# Patient Record
Sex: Male | Born: 1950
Health system: Southern US, Community
[De-identification: ages and names within clinical notes are randomized; demographics above are authoritative.]

## PROBLEM LIST (undated history)

## (undated) DIAGNOSIS — I639 Cerebral infarction, unspecified: Secondary | ICD-10-CM

## (undated) DIAGNOSIS — I10 Essential (primary) hypertension: Secondary | ICD-10-CM

## (undated) HISTORY — PX: OTHER SURGICAL HISTORY: SHX169

## (undated) HISTORY — DX: Cerebral infarction, unspecified: I63.9

## (undated) HISTORY — DX: Essential (primary) hypertension: I10

---

## 2004-05-12 ENCOUNTER — Encounter: Admission: RE | Admit: 2004-05-12 | Discharge: 2004-05-12 | Payer: Self-pay | Admitting: Family Medicine

## 2006-03-28 IMAGING — CT CT HEAD WO/W CM
4 of 8 series · 11 of 30 positions shown, 12 images · IV contrast (omnipaque)
Comparison: none

CLINICAL DATA: Left greater than right ear pain, headaches radiating to frontal region. 
HEAD CT PRE AND POST CONTRAST ? 05/12/04 
Cranial CT was performed before and after administration of 75 cc Omnipaque 300 intravenous contrast.

[Series 3: axial · axial · 0.33mm/px · z∈[+7,+27]mm · 2 of 96 slices shown]
[im 32/96  brain]
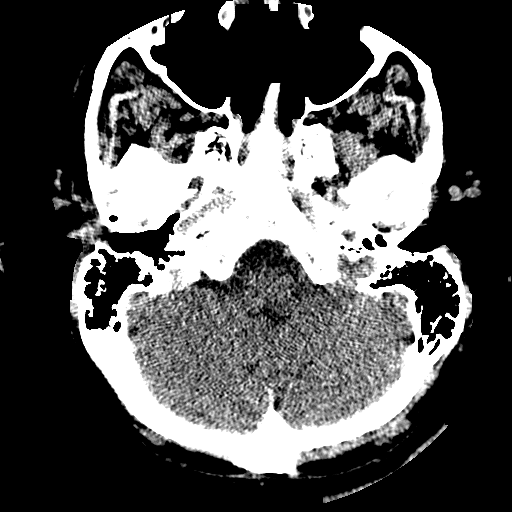
[im 64/96  brain]
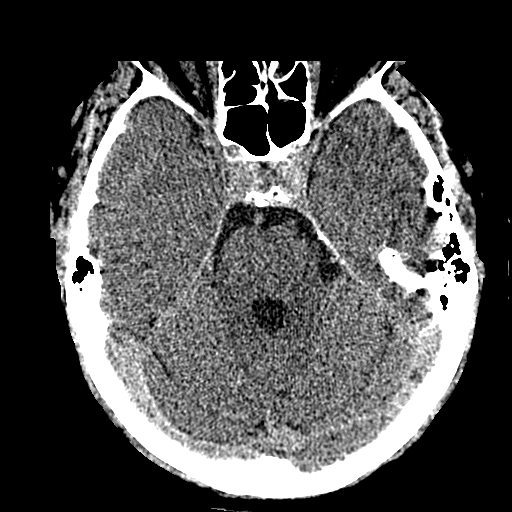

[Series 9: coronal · axial · 0.33mm/px · z∈[-17,+21]mm · 3 of 112 slices shown, 4 images]
[im 28/112  brain]
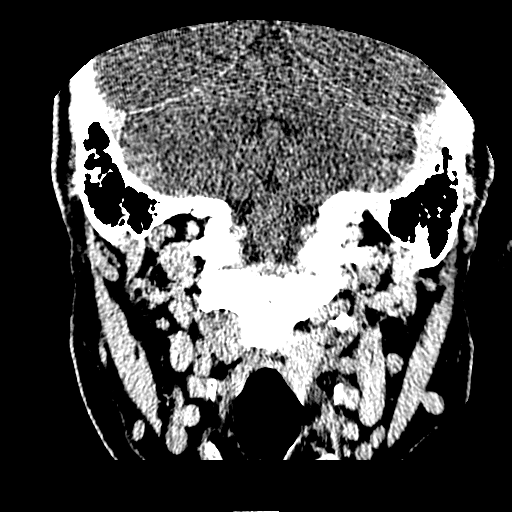
[im 28/112  bone]
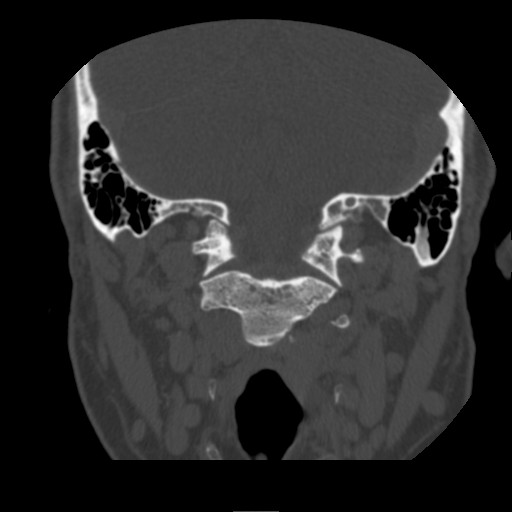
[im 56/112  brain]
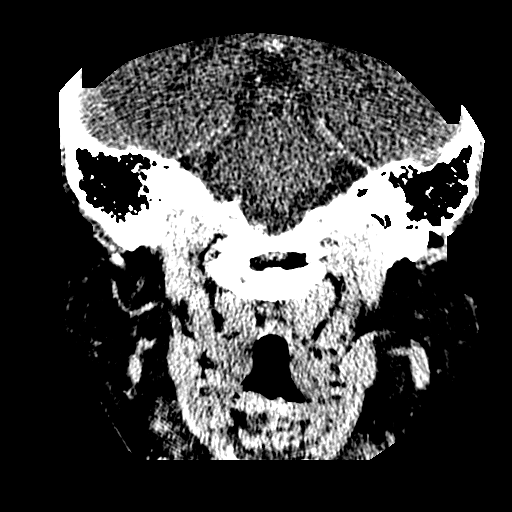
[im 84/112  brain]
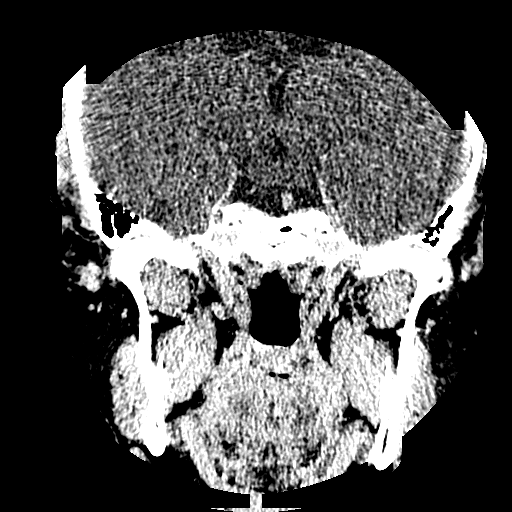

[Series 10: recon 2: coronal · axial · 0.19mm/px · z∈[-31,+7]mm · 3 of 112 slices shown]
[im 28/112  brain]
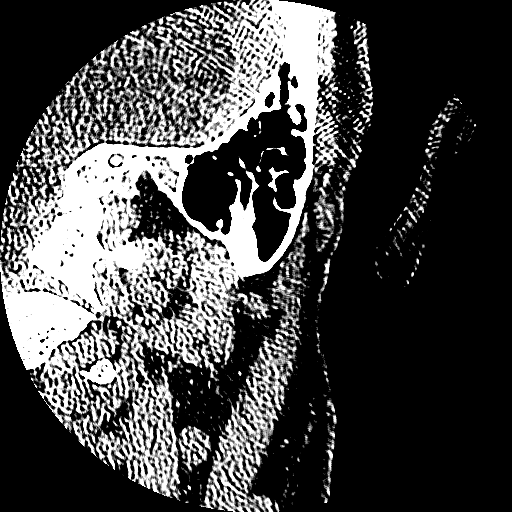
[im 56/112  brain]
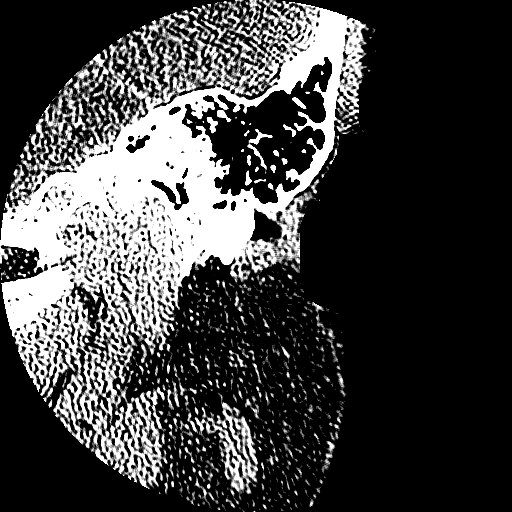
[im 84/112  brain]
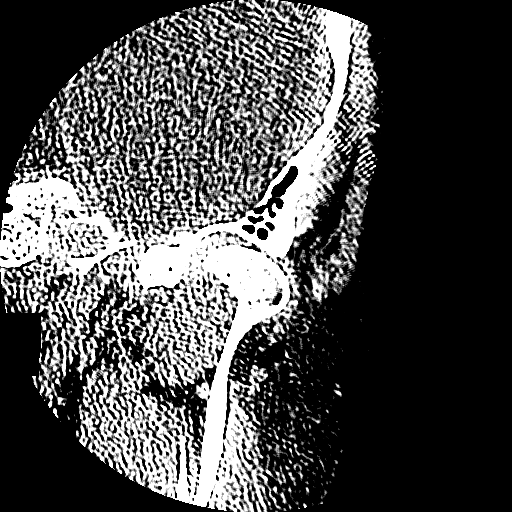

[Series 11: recon 3: coronal · axial · 0.19mm/px · z∈[-31,+7]mm · 3 of 112 slices shown]
[im 28/112  brain]
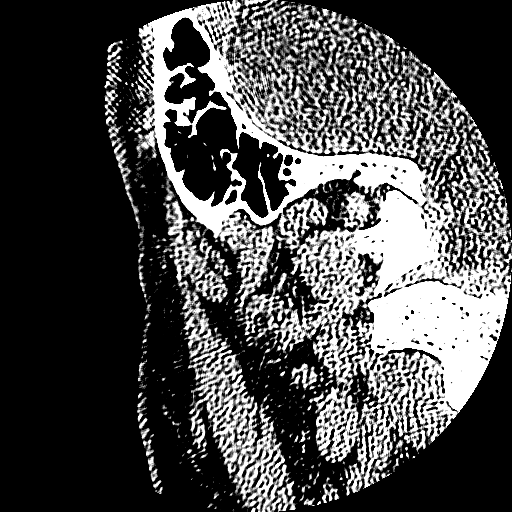
[im 56/112  brain]
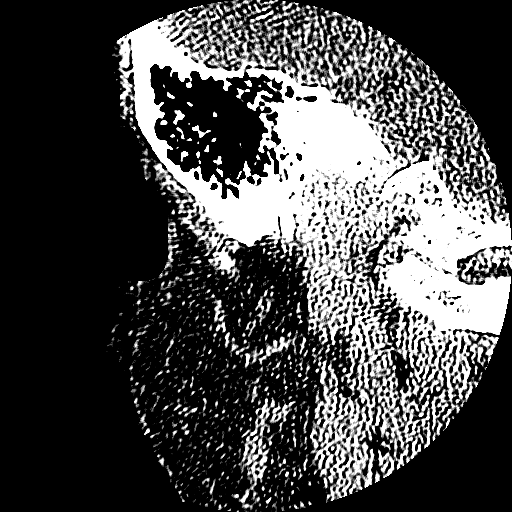
[im 84/112  brain]
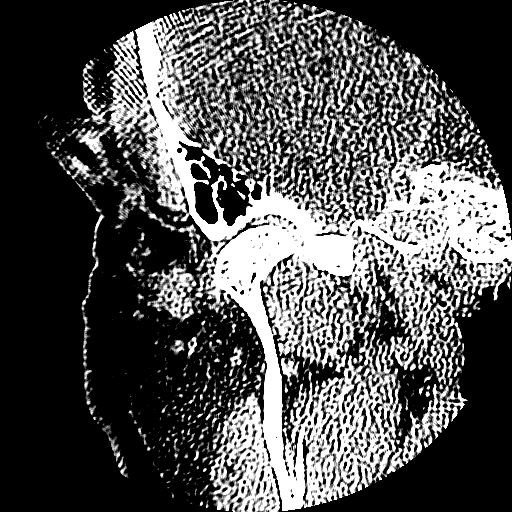

[11 of 30 positions shown; findings below may reference images not displayed]

There is no evidence of enhancing lesions, brain edema, mass effect or intracranial hemorrhage. The ventricles are normal. No extra-axial abnormalities are identified.   Slight mucosal thickening is seen at the bilateral ethmoid air cells consistent with mild chronic ethmoid sinusitis.  

IMPRESSION
1.  Mild chronic ethmoid sinusitis.
2.  Otherwise normal cranial CT.
PETROUS TEMPORAL BONE CT, PRE AND POST CONTRAST ? 05/12/04 
High resolution algorithm axial and direct coronal images at 0.63 m collimation were obtained through the petrous temporal bones pre and post IV contrast administration from preceding head CT.  Bilateral mastoid air cells, middle and external auditory canals are clear.  Bilateral ossicles, tympanic membranes, scutum, tegmen, tympanic sinus, bilateral descending facial nerve canals and bilateral structures of the otic capsule appear normal.  
IMPRESSION
1.  Mild chronic ethmoid and maxillary sinusitis.
2.  Otherwise normal.

## 2007-09-11 ENCOUNTER — Ambulatory Visit (HOSPITAL_BASED_OUTPATIENT_CLINIC_OR_DEPARTMENT_OTHER): Admission: RE | Admit: 2007-09-11 | Discharge: 2007-09-12 | Payer: Self-pay | Admitting: Orthopedic Surgery

## 2008-01-22 ENCOUNTER — Ambulatory Visit (HOSPITAL_BASED_OUTPATIENT_CLINIC_OR_DEPARTMENT_OTHER): Admission: RE | Admit: 2008-01-22 | Discharge: 2008-01-22 | Payer: Self-pay | Admitting: Family Medicine

## 2008-02-01 ENCOUNTER — Ambulatory Visit: Payer: Self-pay | Admitting: Internal Medicine

## 2011-01-17 NOTE — Op Note (Signed)
NAMEDUDLEY, MAGES                ACCOUNT NO.:  [id number]   MEDICAL RECORD NO.:  [id number]          PATIENT TYPE:  AMB   LOCATION:  DSC                          FACILITY:  [facility]   PHYSICIAN:  Mila Homer. Sherlean Foot, M.D. DATE OF BIRTH:  06-Aug-1951   DATE OF PROCEDURE:  09/11/2007  DATE OF DISCHARGE:                               OPERATIVE REPORT   SURGEON:  Mila Homer. Sherlean Foot, M.D.   ASSISTANT:  Legrand Pitts. Duffy, P.A.   ANESTHESIA:  General.   PREOPERATIVE DIAGNOSIS:  Lateral tibial plateau fracture, right knee.   POSTOPERATIVE DIAGNOSIS:  Lateral tibial plateau fracture, right knee.   PROCEDURE:  Right knee arthroscopy with percutaneous screw fixation of  the tibial plateau fracture.   INDICATIONS FOR PROCEDURE:  Patient is 60 years old.  He fell off of a  ladder over the weekend.  He has a displaced lateral tibial plateau  fracture.  Informed consent was obtained.   DESCRIPTION OF PROCEDURE:  Patient was laid supine, administered general  anesthesia.  The right leg was prepped and draped in the usual sterile  fashion.  Inferolateral and inferomedial portals were created with a #11  blade, blunt trocar, and cannula.  The fascial hematoma was evacuated  immediately.  The lead was then lavaged with the great white shaver and  the pump system.  Medial and patellofemoral quadrants were normal.  Then  went into the lateral compartment with a little bit of flexion and varus  stress and identified the tibial plateau fracture.  It was in fact  laying underneath the body of the lateral meniscus.  I then reduced it  with the Darrick Penna reduction clamps under AP and lateral C-arm imaging.  I then removed the scope and placed two percutaneous screws, 70 and 80  mm screws respectively, anterior and posteriorly, checking on AP and  lateral C-arm imaging to insure appropriate length, then recheck with  the arthroscopy view to make sure everything was reduced.  I then closed  with 4-0 nylon  sutures, dressed with a Xeroform dressing, sterile  Webril.   COMPLICATIONS:  None.   DRAINS:  None.           ______________________________  Mila Homer. Sherlean Foot, M.D.     SDL/MEDQ  D:  09/11/2007  T:  09/11/2007  Job:  [id number]

## 2011-01-17 NOTE — Procedures (Signed)
NAMEVIOLET, CART                ACCOUNT NO.:  [id number]   MEDICAL RECORD NO.:  [id number]          PATIENT TYPE:  OUT   LOCATION:  SLEEP CENTER                 FACILITY:  [facility]   PHYSICIAN:  Clinton D. Maple Hudson, MD, FCCP, FACPDATE OF BIRTH:  01[birth year]   DATE OF STUDY:  01/22/2008                            NOCTURNAL POLYSOMNOGRAM   REFERRING PHYSICIAN:   REFERRING PHYSICIAN:  Dr. Jeanmarie Plant.   INDICATION FOR STUDY:  Hypersomnia with sleep apnea.   EPWORTH SLEEPINESS SCORE:  5/24.  BMI 32.3.  Weight 200 pounds.  Height  66 inches.  Neck 16-1/2 inches.   HOME MEDICATIONS:  Charted and reviewed.   SLEEP ARCHITECTURE:  Total sleep time 259.5 minutes with sleep  efficiency 16.3%.  Stage 1 was 11.4%.  Stage 2 77%.  Stage 3 absent.  REM 11.6% of total sleep time.  Sleep latency 57.5 minutes.  REM latency  198 minutes.  Awake after sleep onset 112 minutes.  Arousal index 19.9.  No bedtime medication was taken.   RESPIRATORY DATA:  Apnea hypopnea index (AHI) 5.5 per hour with  respiratory disturbance index (RDI) 10.4 per hour.  This indicates mild  sleep apnea syndrome.  A total of 24 events were counted, all hypopneas,  nonpositional, with REM AHI 20 per hour.  There were insufficient events  to permit CPAP titration by split protocol on the study night.   OXYGEN DATA:  Moderately snoring with oxygen desaturation to a nadir of  79%.  Mean oxygen saturation through the study was 89.6% on room air.  A  total of 35.5 minutes were spent with oxygen saturation less than 88%.   CARDIAC DATA:  Normal sinus rhythm.   MOVEMENT-PARASOMNIA:  Limb jerks were noted averaging 8.3 per hour but  not associated with arousal from sleep.  Bathroom x1.   IMPRESSIONS-RECOMMENDATIONS:  1. Mild obstructive sleep apnea/hypopnea syndrome, AHI 5.5 per hour,      RDI 10.4 per hour.  Events were not positional.  Moderately snoring      with oxygen desaturation to a nadir of 79%.  2. There were  insufficient events on this study night to permit split      protocol CPAP titration.  An RDI of 10.4 would suggest potential      for treatment with CPAP.  Consider return for CPAP titration if      appropriate.  Otherwise, consider alternative therapies.  3. Mean oxygen saturation to the study was only 89.6% with a total of      35.5 minutes of sleep recorded with      saturations less than 88%.  This suggests underlying      cardiopulmonary disease.  Consider oxygen supplementation during      sleep.      Clinton D. Maple Hudson, MD, [facility], FACP  Diplomate, Biomedical engineer of Sleep Medicine  Electronically Signed     CDY/MEDQ  D:  02/01/2008 11:36:42  T:  02/01/2008 11:51:22  Job:  [id number]

## 2011-05-25 LAB — I-STAT 8, (EC8 V) (CONVERTED LAB)
Acid-Base Excess: 2
BUN: 16
Bicarbonate: 27.2 — ABNORMAL HIGH
Chloride: 102
HCT: 49
Hemoglobin: 16.7
Potassium: 4.1
Sodium: 137
pCO2, Ven: 41.4 — ABNORMAL LOW

## 2018-06-12 DIAGNOSIS — I517 Cardiomegaly: Secondary | ICD-10-CM

## 2018-06-12 DIAGNOSIS — J9601 Acute respiratory failure with hypoxia: Secondary | ICD-10-CM | POA: Diagnosis not present

## 2018-06-12 DIAGNOSIS — I639 Cerebral infarction, unspecified: Secondary | ICD-10-CM | POA: Diagnosis not present

## 2018-06-12 DIAGNOSIS — I10 Essential (primary) hypertension: Secondary | ICD-10-CM | POA: Diagnosis not present

## 2018-06-12 DIAGNOSIS — R7303 Prediabetes: Secondary | ICD-10-CM

## 2018-06-12 DIAGNOSIS — I351 Nonrheumatic aortic (valve) insufficiency: Secondary | ICD-10-CM

## 2018-06-13 DIAGNOSIS — R7303 Prediabetes: Secondary | ICD-10-CM | POA: Diagnosis not present

## 2018-06-13 DIAGNOSIS — I639 Cerebral infarction, unspecified: Secondary | ICD-10-CM | POA: Diagnosis not present

## 2018-06-13 DIAGNOSIS — J9601 Acute respiratory failure with hypoxia: Secondary | ICD-10-CM | POA: Diagnosis not present

## 2018-06-13 DIAGNOSIS — I10 Essential (primary) hypertension: Secondary | ICD-10-CM | POA: Diagnosis not present

## 2018-06-14 DIAGNOSIS — J9601 Acute respiratory failure with hypoxia: Secondary | ICD-10-CM | POA: Diagnosis not present

## 2018-06-14 DIAGNOSIS — I639 Cerebral infarction, unspecified: Secondary | ICD-10-CM | POA: Diagnosis not present

## 2018-06-14 DIAGNOSIS — R7303 Prediabetes: Secondary | ICD-10-CM | POA: Diagnosis not present

## 2018-06-14 DIAGNOSIS — I10 Essential (primary) hypertension: Secondary | ICD-10-CM | POA: Diagnosis not present

## 2018-06-15 DIAGNOSIS — R7303 Prediabetes: Secondary | ICD-10-CM | POA: Diagnosis not present

## 2018-06-15 DIAGNOSIS — I639 Cerebral infarction, unspecified: Secondary | ICD-10-CM | POA: Diagnosis not present

## 2018-06-15 DIAGNOSIS — J9601 Acute respiratory failure with hypoxia: Secondary | ICD-10-CM | POA: Diagnosis not present

## 2018-06-15 DIAGNOSIS — I10 Essential (primary) hypertension: Secondary | ICD-10-CM | POA: Diagnosis not present

## 2018-07-09 ENCOUNTER — Ambulatory Visit: Payer: BLUE CROSS/BLUE SHIELD | Admitting: Neurology

## 2018-07-09 ENCOUNTER — Encounter: Payer: Self-pay | Admitting: Neurology

## 2018-07-09 VITALS — BP 142/96 | HR 82 | Ht 65.0 in | Wt 248.0 lb

## 2018-07-09 DIAGNOSIS — I6381 Other cerebral infarction due to occlusion or stenosis of small artery: Secondary | ICD-10-CM | POA: Diagnosis not present

## 2018-07-09 DIAGNOSIS — I69351 Hemiplegia and hemiparesis following cerebral infarction affecting right dominant side: Secondary | ICD-10-CM

## 2018-07-09 NOTE — Patient Instructions (Signed)
I had a long d/w patient about his recent stroke, risk for recurrent stroke/TIAs, personally independently reviewed imaging studies and stroke evaluation results and answered questions.Continue aspirin 325 mg daily  for secondary stroke prevention and maintain strict control of hypertension with blood pressure goal below 130/90, diabetes with hemoglobin A1c goal below 6.5% and lipids with LDL cholesterol goal below 70 mg/dL. I also advised the patient to eat a healthy diet with plenty of whole grains, cereals, fruits and vegetables, exercise regularly and maintain ideal body weight.  I encouraged the patient to continue ongoing home physical and occupational therapy and subsequently transition to outpatient therapy as well when it is completed. I also encouraged him to consider getting the outpatient sleep study to test for sleep apnea but the patient is declining this at the present time. Followup in the future with my nurse practitioner in 3 months or call earlier if needed.  Stroke Prevention Some medical conditions and behaviors are associated with a higher chance of having a stroke. You can help prevent a stroke by making nutrition, lifestyle, and other changes, including managing any medical conditions you may have. What nutrition changes can be made?  Eat healthy foods. You can do this by: ? Choosing foods high in fiber, such as fresh fruits and vegetables and whole grains. ? Eating at least 5 or more servings of fruits and vegetables a day. Try to fill half of your plate at each meal with fruits and vegetables. ? Choosing lean protein foods, such as lean cuts of meat, poultry without skin, fish, tofu, beans, and nuts. ? Eating low-fat dairy products. ? Avoiding foods that are high in salt (sodium). This can help lower blood pressure. ? Avoiding foods that have saturated fat, trans fat, and cholesterol. This can help prevent high cholesterol. ? Avoiding processed and premade foods.  Follow your  health care provider's specific guidelines for losing weight, controlling high blood pressure (hypertension), lowering high cholesterol, and managing diabetes. These may include: ? Reducing your daily calorie intake. ? Limiting your daily sodium intake to 1,500 milligrams (mg). ? Using only healthy fats for cooking, such as olive oil, canola oil, or sunflower oil. ? Counting your daily carbohydrate intake. What lifestyle changes can be made?  Maintain a healthy weight. Talk to your health care provider about your ideal weight.  Get at least 30 minutes of moderate physical activity at least 5 days a week. Moderate activity includes brisk walking, biking, and swimming.  Do not use any products that contain nicotine or tobacco, such as cigarettes and e-cigarettes. If you need help quitting, ask your health care provider. It may also be helpful to avoid exposure to secondhand smoke.  Limit alcohol intake to no more than 1 drink a day for nonpregnant women and 2 drinks a day for men. One drink equals 12 oz of beer, 5 oz of wine, or 1 oz of hard liquor.  Stop any illegal drug use.  Avoid taking birth control pills. Talk to your health care provider about the risks of taking birth control pills if: ? You are over 18 years old. ? You smoke. ? You get migraines. ? You have ever had a blood clot. What other changes can be made?  Manage your cholesterol levels. ? Eating a healthy diet is important for preventing high cholesterol. If cholesterol cannot be managed through diet alone, you may also need to take medicines. ? Take any prescribed medicines to control your cholesterol as told by your health  care provider.  Manage your diabetes. ? Eating a healthy diet and exercising regularly are important parts of managing your blood sugar. If your blood sugar cannot be managed through diet and exercise, you may need to take medicines. ? Take any prescribed medicines to control your diabetes as told by  your health care provider.  Control your hypertension. ? To reduce your risk of stroke, try to keep your blood pressure below 130/80. ? Eating a healthy diet and exercising regularly are an important part of controlling your blood pressure. If your blood pressure cannot be managed through diet and exercise, you may need to take medicines. ? Take any prescribed medicines to control hypertension as told by your health care provider. ? Ask your health care provider if you should monitor your blood pressure at home. ? Have your blood pressure checked every year, even if your blood pressure is normal. Blood pressure increases with age and some medical conditions.  Get evaluated for sleep disorders (sleep apnea). Talk to your health care provider about getting a sleep evaluation if you snore a lot or have excessive sleepiness.  Take over-the-counter and prescription medicines only as told by your health care provider. Aspirin or blood thinners (antiplatelets or anticoagulants) may be recommended to reduce your risk of forming blood clots that can lead to stroke.  Make sure that any other medical conditions you have, such as atrial fibrillation or atherosclerosis, are managed. What are the warning signs of a stroke? The warning signs of a stroke can be easily remembered as BEFAST.  B is for balance. Signs include: ? Dizziness. ? Loss of balance or coordination. ? Sudden trouble walking.  E is for eyes. Signs include: ? A sudden change in vision. ? Trouble seeing.  F is for face. Signs include: ? Sudden weakness or numbness of the face. ? The face or eyelid drooping to one side.  A is for arms. Signs include: ? Sudden weakness or numbness of the arm, usually on one side of the body.  S is for speech. Signs include: ? Trouble speaking (aphasia). ? Trouble understanding.  T is for time. ? These symptoms may represent a serious problem that is an emergency. Do not wait to see if the  symptoms will go away. Get medical help right away. Call your local emergency services (911 in the [place] Do not drive yourself to the hospital.  Other signs of stroke may include: ? A sudden, severe headache with no known cause. ? Nausea or vomiting. ? Seizure.  Where to find more information: For more information, visit:  American Stroke Association: www.strokeassociation.org  National Stroke Association: www.stroke.org  Summary  You can prevent a stroke by eating healthy, exercising, not smoking, limiting alcohol intake, and managing any medical conditions you may have.  Do not use any products that contain nicotine or tobacco, such as cigarettes and e-cigarettes. If you need help quitting, ask your health care provider. It may also be helpful to avoid exposure to secondhand smoke.  Remember BEFAST for warning signs of stroke. Get help right away if you or a loved one has any of these signs. This information is not intended to replace advice given to you by your health care provider. Make sure you discuss any questions you have with your health care provider. Document Released: 09/28/2004 Document Revised: 09/26/2016 Document Reviewed: 09/26/2016 Elsevier Interactive Patient Education  [employer].

## 2018-07-09 NOTE — Progress Notes (Signed)
[facility] [address] [place]. [place] [address] [phone]       OFFICE CONSULT NOTE  William. William Wolf Date of Birth:  10-05-1950 Medical Record Number:  [id number]   Referring IO:NGEX Liling Reason for Referral:  stroke HPI: William Wolf is a 67 year Caucasian male seen today for initial office consultation visit for a stroke.  He is accompanied by his wife.  He was admitted to [facility] on 06/12/2018 with a sudden onset of right-sided weakness and numbness.  Initial CT scan of the head was unremarkable but MRI scan of the brain which I personally reviewed in PACS shows a small left coronary radiata lacunar infarct.  CT angiogram of the brain and neck were both obtained and did not show any significant large vessel intracranial or extracranial stenosis.  LDL cholesterol was 98 mg percent.  Hemoglobin A1c was 6.2.  Transthoracic echo showed normal ejection fraction without cardiac source of embolism.  Patient had been noncompliant with aspirin as well as blood pressure medications.  He was started on aspirin and tighter blood pressure control.  [facility]  He has obtain improvement in his right-sided numbness though he still has significant weakness of his right grip and hand.  He is able to ambulate and is finishing therapy and plans to go home this weekend.  He is tolerating aspirin well without bruising or bleeding.  His blood pressure is better controlled and today it is 142/96.  He is also tolerating Lipitor well without muscle aches and pains.  Patient does admit to snoring and feeling tired.  He has not ever been evaluated for sleep apnea yet.  He denies any prior history of strokes TIAs seizures or other significant neurological problems. ROS:   14 system review of systems is positive for hearing loss, ringing in the ears, rash, incontinence, diarrhea, headache, numbness, slurred speech, snoring and all other  systems negative  PMH:  Past Medical History:  Diagnosis Date  . Diabetes mellitus without complication (HCC)   . Hypertension   . Stroke [facility])     Social History:  Social History   Socioeconomic History  . Marital status: Married    Spouse name: Not on file  . Number of children: Not on file  . Years of education: Not on file  . Highest education level: Not on file  Occupational History  . Not on file  Social Needs  . Financial resource strain: Not on file  . Food insecurity:    Worry: Not on file    Inability: Not on file  . Transportation needs:    Medical: Not on file    Non-medical: Not on file  Tobacco Use  . Smoking status: Never Smoker  . Smokeless tobacco: Never Used  Substance and Sexual Activity  . Alcohol use: Not Currently  . Drug use: Not Currently  . Sexual activity: Not on file  Lifestyle  . Physical activity:    Days per week: Not on file    Minutes per session: Not on file  . Stress: Not on file  Relationships  . Social connections:    Talks on phone: Not on file    Gets together: Not on file    Attends religious service: Not on file    Active member of club or organization: Not on file    Attends meetings of clubs or organizations: Not on file    Relationship status: Not on file  .  Intimate partner violence:    Fear of current or ex partner: Not on file    Emotionally abused: Not on file    Physically abused: Not on file    Forced sexual activity: Not on file  Other Topics Concern  . Not on file  Social History Narrative  . Not on file    Medications:   Current Outpatient Medications on File Prior to Visit  Medication Sig Dispense Refill  . acetaminophen (TYLENOL) 325 MG tablet Take 650 mg by mouth every 6 (six) hours as needed.    Marland Kitchen amLODipine (NORVASC) 10 MG tablet TK 1 T PO D FOR HIGH BP  0  . aspirin 325 MG tablet Take 325 mg by mouth daily.    Marland Kitchen atorvastatin (LIPITOR) 80 MG tablet Take 80 mg by mouth daily.    Marland Kitchen LISINOPRIL PO  Take 10 mg by mouth.    . Melatonin 5 MG TABS Take by mouth.    . Sennosides (SENOKOT PO) Take 8.6 mg by mouth.    . traMADol (ULTRAM) 50 MG tablet Take by mouth every 6 (six) hours as needed.    . carvedilol (COREG) 25 MG tablet   4  . meloxicam (MOBIC) 7.5 MG tablet   0  . nabumetone (RELAFEN) 750 MG tablet   4  . omeprazole (PRILOSEC) 40 MG capsule TAKE 1 CAPSULE BY MOUTH ONCE DAILY FOR GERD/REFLUX  0   No current facility-administered medications on file prior to visit.     Allergies:  Not on File  Physical Exam General: Obese middle-aged male seated, in no evident distress Head: head normocephalic and atraumatic.   Neck: supple with no carotid or supraclavicular bruits Cardiovascular: regular rate and rhythm, no murmurs Musculoskeletal: no deformity right shoulder pain and limitation of abduction due to chronic arthritis/rotator cuff injury Skin:  no rash/petichiae Vascular:  Normal pulses all extremities  Neurologic Exam Mental Status: Awake and fully alert. Oriented to place and time. Recent and remote memory intact. Attention span, concentration and fund of knowledge appropriate. Mood and affect appropriate.  Cranial Nerves: Fundoscopic exam reveals sharp disc margins. Pupils equal, briskly reactive to light. Extraocular movements full without nystagmus. Visual fields full to confrontation. Hearing intact. Facial sensation intact.  Mild right lower facial asymmetry., tongue, palate moves normally and symmetrically.  Motor: Normal bulk and tone. Normal strength in all tested extremity muscles on the left side.  Right upper extremity drift.  Right upper extremity strength is 3/5 with significant weakness of right grip and intrinsic hand muscles.  Right shoulder abduction is limited due to chronic shoulder problem.  Mild weakness of right hip flexors and ankle dorsiflexors..  Diminished fine motor skills on the right.  Orbits left to right upper extremity.  Mild right lip  weakness. Sensory.: intact to touch , pinprick , position and vibratory sensation.  Coordination: Rapid alternating movements normal in all extremities. Finger-to-nose and heel-to-shin performed accurately bilaterally. Gait and Station: Arises from chair without difficulty. Stance is normal. Gait demonstrates slight dragging of the right foot.  E . Able to heel, toe and tandem walk without difficulty.  Reflexes: 1+ and symmetric. Toes downgoing.   NIHSS 3 Modified Rankin  2   ASSESSMENT: 67 year old male with left subcortical lacunar infarct in October 2019 secondary to small vessel disease.  Vascular risk factors of hypertension, hyperlipidemia, obesity and suspected sleep apnea     PLAN: I had a long d/w patient about his recent stroke, risk for recurrent stroke/TIAs, personally  independently reviewed imaging studies and stroke evaluation results and answered questions.Continue aspirin 325 mg daily  for secondary stroke prevention and maintain strict control of hypertension with blood pressure goal below 130/90, diabetes with hemoglobin A1c goal below 6.5% and lipids with LDL cholesterol goal below 70 mg/dL. I also advised the patient to eat a healthy diet with plenty of whole grains, cereals, fruits and vegetables, exercise regularly and maintain ideal body weight.  I encouraged the patient to continue ongoing home physical and occupational therapy and subsequently transition to outpatient therapy as well when it is completed. I also encouraged him to consider getting the outpatient sleep study to test for sleep apnea but the patient is declining this at the present time.  Greater than 50% time during this 45-minute consultation visit was spent on counseling and coordination of care about his stroke and answering questions.  Followup in the future with my nurse practitioner in 3 months or call earlier if needed. Delia Heady, MD  [facility] [address] [place], [place] [address]  Phone [phone] Fax [phone] Note: This document was prepared with digital dictation and possible smart phrase technology. Any transcriptional errors that result from this process are unintentional.

## 2018-08-14 DIAGNOSIS — M6281 Muscle weakness (generalized): Secondary | ICD-10-CM | POA: Diagnosis not present

## 2018-08-14 DIAGNOSIS — R2689 Other abnormalities of gait and mobility: Secondary | ICD-10-CM | POA: Diagnosis not present

## 2018-08-15 DIAGNOSIS — H2513 Age-related nuclear cataract, bilateral: Secondary | ICD-10-CM | POA: Diagnosis not present

## 2018-08-15 DIAGNOSIS — H5203 Hypermetropia, bilateral: Secondary | ICD-10-CM | POA: Diagnosis not present

## 2018-08-15 DIAGNOSIS — R2689 Other abnormalities of gait and mobility: Secondary | ICD-10-CM | POA: Diagnosis not present

## 2018-08-15 DIAGNOSIS — M6281 Muscle weakness (generalized): Secondary | ICD-10-CM | POA: Diagnosis not present

## 2018-08-15 DIAGNOSIS — H52223 Regular astigmatism, bilateral: Secondary | ICD-10-CM | POA: Diagnosis not present

## 2018-08-21 DIAGNOSIS — M6281 Muscle weakness (generalized): Secondary | ICD-10-CM | POA: Diagnosis not present

## 2018-08-21 DIAGNOSIS — R2689 Other abnormalities of gait and mobility: Secondary | ICD-10-CM | POA: Diagnosis not present

## 2018-08-23 DIAGNOSIS — R2689 Other abnormalities of gait and mobility: Secondary | ICD-10-CM | POA: Diagnosis not present

## 2018-08-23 DIAGNOSIS — M6281 Muscle weakness (generalized): Secondary | ICD-10-CM | POA: Diagnosis not present

## 2018-08-27 DIAGNOSIS — M6281 Muscle weakness (generalized): Secondary | ICD-10-CM | POA: Diagnosis not present

## 2018-08-27 DIAGNOSIS — R2689 Other abnormalities of gait and mobility: Secondary | ICD-10-CM | POA: Diagnosis not present

## 2018-09-02 DIAGNOSIS — M6281 Muscle weakness (generalized): Secondary | ICD-10-CM | POA: Diagnosis not present

## 2018-09-02 DIAGNOSIS — R2689 Other abnormalities of gait and mobility: Secondary | ICD-10-CM | POA: Diagnosis not present

## 2018-09-05 DIAGNOSIS — M6281 Muscle weakness (generalized): Secondary | ICD-10-CM | POA: Diagnosis not present

## 2018-09-05 DIAGNOSIS — R2689 Other abnormalities of gait and mobility: Secondary | ICD-10-CM | POA: Diagnosis not present

## 2018-09-10 DIAGNOSIS — R2689 Other abnormalities of gait and mobility: Secondary | ICD-10-CM | POA: Diagnosis not present

## 2018-09-10 DIAGNOSIS — M6281 Muscle weakness (generalized): Secondary | ICD-10-CM | POA: Diagnosis not present

## 2018-09-12 DIAGNOSIS — E119 Type 2 diabetes mellitus without complications: Secondary | ICD-10-CM | POA: Diagnosis not present

## 2018-09-12 DIAGNOSIS — I69359 Hemiplegia and hemiparesis following cerebral infarction affecting unspecified side: Secondary | ICD-10-CM | POA: Diagnosis not present

## 2018-09-12 DIAGNOSIS — M79641 Pain in right hand: Secondary | ICD-10-CM | POA: Diagnosis not present

## 2018-09-12 DIAGNOSIS — J309 Allergic rhinitis, unspecified: Secondary | ICD-10-CM | POA: Diagnosis not present

## 2018-09-13 DIAGNOSIS — R2689 Other abnormalities of gait and mobility: Secondary | ICD-10-CM | POA: Diagnosis not present

## 2018-09-13 DIAGNOSIS — M6281 Muscle weakness (generalized): Secondary | ICD-10-CM | POA: Diagnosis not present

## 2018-09-17 DIAGNOSIS — M6281 Muscle weakness (generalized): Secondary | ICD-10-CM | POA: Diagnosis not present

## 2018-09-17 DIAGNOSIS — R2689 Other abnormalities of gait and mobility: Secondary | ICD-10-CM | POA: Diagnosis not present

## 2018-09-20 DIAGNOSIS — M6281 Muscle weakness (generalized): Secondary | ICD-10-CM | POA: Diagnosis not present

## 2018-09-20 DIAGNOSIS — R2689 Other abnormalities of gait and mobility: Secondary | ICD-10-CM | POA: Diagnosis not present

## 2018-09-24 DIAGNOSIS — R2689 Other abnormalities of gait and mobility: Secondary | ICD-10-CM | POA: Diagnosis not present

## 2018-09-24 DIAGNOSIS — M6281 Muscle weakness (generalized): Secondary | ICD-10-CM | POA: Diagnosis not present

## 2018-09-26 DIAGNOSIS — M6281 Muscle weakness (generalized): Secondary | ICD-10-CM | POA: Diagnosis not present

## 2018-09-26 DIAGNOSIS — R2689 Other abnormalities of gait and mobility: Secondary | ICD-10-CM | POA: Diagnosis not present

## 2018-09-30 DIAGNOSIS — M6281 Muscle weakness (generalized): Secondary | ICD-10-CM | POA: Diagnosis not present

## 2018-09-30 DIAGNOSIS — R2689 Other abnormalities of gait and mobility: Secondary | ICD-10-CM | POA: Diagnosis not present

## 2018-10-09 ENCOUNTER — Ambulatory Visit (INDEPENDENT_AMBULATORY_CARE_PROVIDER_SITE_OTHER): Payer: Medicare Other | Admitting: Adult Health

## 2018-10-09 ENCOUNTER — Encounter: Payer: Self-pay | Admitting: Adult Health

## 2018-10-09 VITALS — BP 165/101 | HR 80 | Ht 65.0 in | Wt 242.0 lb

## 2018-10-09 DIAGNOSIS — I10 Essential (primary) hypertension: Secondary | ICD-10-CM

## 2018-10-09 DIAGNOSIS — I6381 Other cerebral infarction due to occlusion or stenosis of small artery: Secondary | ICD-10-CM

## 2018-10-09 DIAGNOSIS — E785 Hyperlipidemia, unspecified: Secondary | ICD-10-CM | POA: Diagnosis not present

## 2018-10-09 DIAGNOSIS — I69351 Hemiplegia and hemiparesis following cerebral infarction affecting right dominant side: Secondary | ICD-10-CM | POA: Diagnosis not present

## 2018-10-09 NOTE — Patient Instructions (Signed)
Continue aspirin 81 mg daily  and lipitor  for secondary stroke prevention  Continue to follow up with PCP regarding cholesterol and blood pressure management   Continue therapies for continued weakness  Continue to monitor blood pressure at home  Maintain strict control of hypertension with blood pressure goal below 130/90, diabetes with hemoglobin A1c goal below 6.5% and cholesterol with LDL cholesterol (bad cholesterol) goal below 70 mg/dL. I also advised the patient to eat a healthy diet with plenty of whole grains, cereals, fruits and vegetables, exercise regularly and maintain ideal body weight.  Followup in the future with me in 6 months or call earlier if needed       Thank you for coming to see [place] at [facility]. I hope we have been able to provide you high quality care today.  You may receive a patient satisfaction survey over the next few weeks. We would appreciate your feedback and comments so that we may continue to improve ourselves and the health of our patients.

## 2018-10-09 NOTE — Progress Notes (Signed)
[facility] [address] [place]. [place] [id number] [phone]       OFFICE CONSULT NOTE  Mr. William GeorgesHerbert Whitenight Date of Birth:  August 09, 1951 Medical Record Number:  [id number]   Referring OZ:HYQMD:Chen Liling Reason for Referral:  Stroke    Chief Complaint  Patient presents with  . Follow-up    Stroke follow up room in back hallway pt alone     HPI: 10/09/18  [name] returns today for follow-up visit.  Overall, he has been stable from a stroke standpoint with residual RUE weakness but does endorse improvement compared to prior visit. He continues to participate in [facility] with continued exercises and shock treatments.  He is able to perform more activities with his right hand/arm and continues to stay active.  He continues on aspirin without side effects of bleeding or bruising.  Continues on atorvastatin without side effects myalgias.  Blood pressure today 165/101.  He states he does monitor at home and typically 130s/80s but does endorse elevation likely due to driving on 29.  He is asymptomatic with elevated BP.  He continues to decline sleep apnea referral.  Denies new or worsening stroke/TIA symptoms.  HISTORY SUMMARY:  Mr William Wolf is a [age] year Caucasian male seen today for initial office consultation visit for a stroke.  He is accompanied by his wife.  He was [facility] on 06/12/2018 with a sudden onset of right-sided weakness and numbness.  Initial CT scan of the head was unremarkable but MRI scan of the brain which I personally reviewed in PACS shows a small left coronary radiata lacunar infarct.  CT angiogram of the brain and neck were both obtained and did not show any significant large vessel intracranial or extracranial stenosis.  LDL cholesterol was 98 mg percent.  Hemoglobin A1c was 6.2.  Transthoracic echo showed normal ejection fraction without cardiac source of embolism.  Patient had been noncompliant with aspirin as well as  blood pressure medications.  He was started on aspirin and tighter blood pressure control.  [facility]  He has obtain improvement in his right-sided numbness though he still has significant weakness of his right grip and hand.  He is able to ambulate and is finishing therapy and plans to go home this weekend.  He is tolerating aspirin well without bruising or bleeding.  His blood pressure is better controlled and today it is 142/96.  He is also tolerating Lipitor well without muscle aches and pains.  Patient does admit to snoring and feeling tired.  He has not ever been evaluated for sleep apnea yet.  He denies any prior history of strokes TIAs seizures or other significant neurological problems.   ROS:   14 system review of systems is positive for joint pain, joint swelling, aching muscles, numbness and weakness and all other systems negative  PMH:  Past Medical History:  Diagnosis Date  . Hypertension   . Stroke [facility](HCC)     Social History:  Social History   Socioeconomic History  . Marital status: Married    Spouse name: Not on file  . Number of children: Not on file  . Years of education: Not on file  . Highest education level: Not on file  Occupational History  . Not on file  Social Needs  . Financial resource strain: Not on file  . Food insecurity:    Worry: Not on file    Inability: Not on  file  . Transportation needs:    Medical: Not on file    Non-medical: Not on file  Tobacco Use  . Smoking status: Never Smoker  . Smokeless tobacco: Never Used  Substance and Sexual Activity  . Alcohol use: Not Currently  . Drug use: Not Currently  . Sexual activity: Not on file  Lifestyle  . Physical activity:    Days per week: Not on file    Minutes per session: Not on file  . Stress: Not on file  Relationships  . Social connections:    Talks on phone: Not on file    Gets together: Not on file    Attends religious  service: Not on file    Active member of club or organization: Not on file    Attends meetings of clubs or organizations: Not on file    Relationship status: Not on file  . Intimate partner violence:    Fear of current or ex partner: Not on file    Emotionally abused: Not on file    Physically abused: Not on file    Forced sexual activity: Not on file  Other Topics Concern  . Not on file  Social History Narrative  . Not on file    Medications:   Current Outpatient Medications on File Prior to Visit  Medication Sig Dispense Refill  . acetaminophen (TYLENOL) 325 MG tablet Take 650 mg by mouth every 6 (six) hours as needed.    Marland Kitchen amLODipine (NORVASC) 10 MG tablet TK 1 T PO D FOR HIGH BP  0  . aspirin 325 MG tablet Take 325 mg by mouth daily.    Marland Kitchen atorvastatin (LIPITOR) 80 MG tablet Take 80 mg by mouth daily.    . carvedilol (COREG) 25 MG tablet   4  . LISINOPRIL PO Take 10 mg by mouth.    . Melatonin 5 MG TABS Take by mouth.    . meloxicam (MOBIC) 7.5 MG tablet   0  . nabumetone (RELAFEN) 750 MG tablet   4  . omeprazole (PRILOSEC) 40 MG capsule TAKE 1 CAPSULE BY MOUTH ONCE DAILY FOR GERD/REFLUX  0  . Sennosides (SENOKOT PO) Take 8.6 mg by mouth.    . traMADol (ULTRAM) 50 MG tablet Take by mouth every 6 (six) hours as needed.     No current facility-administered medications on file prior to visit.     Allergies:  Not on File  Today's Vitals   10/09/18 0959  BP: (!) 165/101  Pulse: 80  Weight: 242 lb (109.8 kg)  Height: 5\' 5"  (1.651 m)   Body mass index is 40.27 kg/m.   Physical Exam General: Obese pleasant middle-aged male seated, in no evident distress Head: head normocephalic and atraumatic.   Neck: supple with no carotid or supraclavicular bruits Cardiovascular: regular rate and rhythm, no murmurs Musculoskeletal: no deformity  Skin:  no rash/petichiae Vascular:  Normal pulses all extremities  Neurologic Exam Mental Status: Awake and fully alert. Oriented to  place and time. Recent and remote memory intact. Attention span, concentration and fund of knowledge appropriate. Mood and affect appropriate.  Cranial Nerves: Pupils equal, briskly reactive to light. Extraocular movements full without nystagmus. Visual fields full to confrontation. Hearing intact. Facial sensation intact.  Mild right lower facial asymmetry., tongue, palate moves normally and symmetrically.  Motor: Normal bulk and tone. Normal strength in all tested extremity muscles on the left side.  Right upper extremity drift.  Right upper extremity strength is 3+/5 with  mild right grip weakness and decreased finger dexterity.  Right shoulder abduction is limited due to chronic shoulder problem.  Right grip weakness slightly contributed to swelling of right hand.  Mild weakness of right hip flexor.  Sensory.: intact to touch , pinprick , position and vibratory sensation.  Coordination:Decreased right hand finger dexterity.  Orbits left arm over right arm. Gait and Station: Arises from chair without difficulty. Stance is normal. Gait demonstrates hemiplegic gait but ambulates without assistive device Reflexes: 1+ and symmetric. Toes downgoing.     ASSESSMENT: 68 year old male with left subcortical lacunar infarct in October 2019 secondary to small vessel disease.  Vascular risk factors of hypertension, hyperlipidemia, obesity and suspected sleep apnea.  He has been seen today for follow-up visit with continued improvement of right hemiparesis post stroke.    PLAN: -Continue aspirin 81 mg and atorvastatin for secondary stroke prevention -Continue to follow with PCP for HTN and HLD management -Continue current therapies for continued stroke deficits -Advised patient to call office when he is ready to undergo sleep apnea testing.  Educated him on associated risk factors with untreated OSA relating to stroke and heart disease.  He continues to decline at this time. -Monitor BP at home -Maintain  strict control of hypertension with blood pressure goal below 130/90, diabetes with hemoglobin A1c goal below 6.5% and cholesterol with LDL cholesterol (bad cholesterol) goal below 70 mg/dL. I also advised the patient to eat a healthy diet with plenty of whole grains, cereals, fruits and vegetables, exercise regularly and maintain ideal body weight.  Followup in the future with me in 6 months or call earlier if needed  Greater than 50% time during this 45-minute consultation visit was spent on counseling and coordination of care about his stroke and answering questions.    George Hugh, AGNP-BC  [facility] [address] [place], [place] [address]  Phone [phone] Fax [phone] Note: This document was prepared with digital dictation and possible smart phrase technology. Any transcriptional errors that result from this process are unintentional.

## 2018-10-09 NOTE — Progress Notes (Signed)
I agree with the above plan 

## 2018-10-11 DIAGNOSIS — R2689 Other abnormalities of gait and mobility: Secondary | ICD-10-CM | POA: Diagnosis not present

## 2018-10-11 DIAGNOSIS — M6281 Muscle weakness (generalized): Secondary | ICD-10-CM | POA: Diagnosis not present

## 2018-10-14 DIAGNOSIS — Z6841 Body Mass Index (BMI) 40.0 and over, adult: Secondary | ICD-10-CM | POA: Diagnosis not present

## 2018-10-14 DIAGNOSIS — I10 Essential (primary) hypertension: Secondary | ICD-10-CM | POA: Diagnosis not present

## 2018-10-15 DIAGNOSIS — M6281 Muscle weakness (generalized): Secondary | ICD-10-CM | POA: Diagnosis not present

## 2018-10-15 DIAGNOSIS — R2689 Other abnormalities of gait and mobility: Secondary | ICD-10-CM | POA: Diagnosis not present

## 2018-10-18 DIAGNOSIS — R2689 Other abnormalities of gait and mobility: Secondary | ICD-10-CM | POA: Diagnosis not present

## 2018-10-18 DIAGNOSIS — M6281 Muscle weakness (generalized): Secondary | ICD-10-CM | POA: Diagnosis not present

## 2018-10-22 DIAGNOSIS — R2689 Other abnormalities of gait and mobility: Secondary | ICD-10-CM | POA: Diagnosis not present

## 2018-10-22 DIAGNOSIS — M6281 Muscle weakness (generalized): Secondary | ICD-10-CM | POA: Diagnosis not present

## 2018-10-29 DIAGNOSIS — M6281 Muscle weakness (generalized): Secondary | ICD-10-CM | POA: Diagnosis not present

## 2018-10-29 DIAGNOSIS — R2689 Other abnormalities of gait and mobility: Secondary | ICD-10-CM | POA: Diagnosis not present

## 2018-11-05 DIAGNOSIS — R2689 Other abnormalities of gait and mobility: Secondary | ICD-10-CM | POA: Diagnosis not present

## 2018-11-05 DIAGNOSIS — M6281 Muscle weakness (generalized): Secondary | ICD-10-CM | POA: Diagnosis not present

## 2018-11-12 DIAGNOSIS — M6281 Muscle weakness (generalized): Secondary | ICD-10-CM | POA: Diagnosis not present

## 2018-11-12 DIAGNOSIS — R2689 Other abnormalities of gait and mobility: Secondary | ICD-10-CM | POA: Diagnosis not present

## 2018-11-19 DIAGNOSIS — M6281 Muscle weakness (generalized): Secondary | ICD-10-CM | POA: Diagnosis not present

## 2018-11-19 DIAGNOSIS — R2689 Other abnormalities of gait and mobility: Secondary | ICD-10-CM | POA: Diagnosis not present

## 2018-11-26 DIAGNOSIS — R2689 Other abnormalities of gait and mobility: Secondary | ICD-10-CM | POA: Diagnosis not present

## 2018-11-26 DIAGNOSIS — M6281 Muscle weakness (generalized): Secondary | ICD-10-CM | POA: Diagnosis not present

## 2018-12-26 DIAGNOSIS — M6281 Muscle weakness (generalized): Secondary | ICD-10-CM | POA: Diagnosis not present

## 2018-12-26 DIAGNOSIS — R2689 Other abnormalities of gait and mobility: Secondary | ICD-10-CM | POA: Diagnosis not present

## 2019-01-01 DIAGNOSIS — R2689 Other abnormalities of gait and mobility: Secondary | ICD-10-CM | POA: Diagnosis not present

## 2019-01-01 DIAGNOSIS — M6281 Muscle weakness (generalized): Secondary | ICD-10-CM | POA: Diagnosis not present

## 2019-01-07 DIAGNOSIS — M6281 Muscle weakness (generalized): Secondary | ICD-10-CM | POA: Diagnosis not present

## 2019-01-07 DIAGNOSIS — R2689 Other abnormalities of gait and mobility: Secondary | ICD-10-CM | POA: Diagnosis not present

## 2019-01-22 DIAGNOSIS — R2689 Other abnormalities of gait and mobility: Secondary | ICD-10-CM | POA: Diagnosis not present

## 2019-01-22 DIAGNOSIS — M6281 Muscle weakness (generalized): Secondary | ICD-10-CM | POA: Diagnosis not present

## 2019-01-29 DIAGNOSIS — R2689 Other abnormalities of gait and mobility: Secondary | ICD-10-CM | POA: Diagnosis not present

## 2019-01-29 DIAGNOSIS — M6281 Muscle weakness (generalized): Secondary | ICD-10-CM | POA: Diagnosis not present

## 2019-02-06 DIAGNOSIS — R2689 Other abnormalities of gait and mobility: Secondary | ICD-10-CM | POA: Diagnosis not present

## 2019-02-06 DIAGNOSIS — M6281 Muscle weakness (generalized): Secondary | ICD-10-CM | POA: Diagnosis not present

## 2019-02-12 DIAGNOSIS — M6281 Muscle weakness (generalized): Secondary | ICD-10-CM | POA: Diagnosis not present

## 2019-02-12 DIAGNOSIS — R2689 Other abnormalities of gait and mobility: Secondary | ICD-10-CM | POA: Diagnosis not present

## 2019-02-19 DIAGNOSIS — R2689 Other abnormalities of gait and mobility: Secondary | ICD-10-CM | POA: Diagnosis not present

## 2019-02-19 DIAGNOSIS — M6281 Muscle weakness (generalized): Secondary | ICD-10-CM | POA: Diagnosis not present

## 2019-02-27 DIAGNOSIS — R2689 Other abnormalities of gait and mobility: Secondary | ICD-10-CM | POA: Diagnosis not present

## 2019-02-27 DIAGNOSIS — M6281 Muscle weakness (generalized): Secondary | ICD-10-CM | POA: Diagnosis not present

## 2019-03-03 DIAGNOSIS — E119 Type 2 diabetes mellitus without complications: Secondary | ICD-10-CM | POA: Diagnosis not present

## 2019-03-03 DIAGNOSIS — I69359 Hemiplegia and hemiparesis following cerebral infarction affecting unspecified side: Secondary | ICD-10-CM | POA: Diagnosis not present

## 2019-03-03 DIAGNOSIS — E7800 Pure hypercholesterolemia, unspecified: Secondary | ICD-10-CM | POA: Diagnosis not present

## 2019-03-03 DIAGNOSIS — I10 Essential (primary) hypertension: Secondary | ICD-10-CM | POA: Diagnosis not present

## 2019-03-05 DIAGNOSIS — M6281 Muscle weakness (generalized): Secondary | ICD-10-CM | POA: Diagnosis not present

## 2019-03-05 DIAGNOSIS — R2689 Other abnormalities of gait and mobility: Secondary | ICD-10-CM | POA: Diagnosis not present

## 2019-03-13 DIAGNOSIS — R2689 Other abnormalities of gait and mobility: Secondary | ICD-10-CM | POA: Diagnosis not present

## 2019-03-13 DIAGNOSIS — M6281 Muscle weakness (generalized): Secondary | ICD-10-CM | POA: Diagnosis not present

## 2019-03-19 DIAGNOSIS — R2689 Other abnormalities of gait and mobility: Secondary | ICD-10-CM | POA: Diagnosis not present

## 2019-03-19 DIAGNOSIS — M6281 Muscle weakness (generalized): Secondary | ICD-10-CM | POA: Diagnosis not present

## 2019-03-19 DIAGNOSIS — I10 Essential (primary) hypertension: Secondary | ICD-10-CM | POA: Diagnosis not present

## 2019-04-03 ENCOUNTER — Encounter: Payer: Self-pay | Admitting: Adult Health

## 2019-04-03 DIAGNOSIS — R2689 Other abnormalities of gait and mobility: Secondary | ICD-10-CM | POA: Diagnosis not present

## 2019-04-03 DIAGNOSIS — M6281 Muscle weakness (generalized): Secondary | ICD-10-CM | POA: Diagnosis not present

## 2019-04-09 DIAGNOSIS — M6281 Muscle weakness (generalized): Secondary | ICD-10-CM | POA: Diagnosis not present

## 2019-04-09 DIAGNOSIS — R2689 Other abnormalities of gait and mobility: Secondary | ICD-10-CM | POA: Diagnosis not present

## 2019-04-11 ENCOUNTER — Ambulatory Visit: Payer: BLUE CROSS/BLUE SHIELD | Admitting: Adult Health

## 2019-04-16 DIAGNOSIS — M6281 Muscle weakness (generalized): Secondary | ICD-10-CM | POA: Diagnosis not present

## 2019-04-16 DIAGNOSIS — R2689 Other abnormalities of gait and mobility: Secondary | ICD-10-CM | POA: Diagnosis not present

## 2019-04-24 DIAGNOSIS — M6281 Muscle weakness (generalized): Secondary | ICD-10-CM | POA: Diagnosis not present

## 2019-04-24 DIAGNOSIS — R2689 Other abnormalities of gait and mobility: Secondary | ICD-10-CM | POA: Diagnosis not present

## 2019-05-01 DIAGNOSIS — M6281 Muscle weakness (generalized): Secondary | ICD-10-CM | POA: Diagnosis not present

## 2019-05-01 DIAGNOSIS — R2689 Other abnormalities of gait and mobility: Secondary | ICD-10-CM | POA: Diagnosis not present

## 2019-05-07 DIAGNOSIS — R2689 Other abnormalities of gait and mobility: Secondary | ICD-10-CM | POA: Diagnosis not present

## 2019-05-07 DIAGNOSIS — M6281 Muscle weakness (generalized): Secondary | ICD-10-CM | POA: Diagnosis not present

## 2019-05-14 DIAGNOSIS — R2689 Other abnormalities of gait and mobility: Secondary | ICD-10-CM | POA: Diagnosis not present

## 2019-05-14 DIAGNOSIS — M6281 Muscle weakness (generalized): Secondary | ICD-10-CM | POA: Diagnosis not present

## 2019-05-16 DIAGNOSIS — M79606 Pain in leg, unspecified: Secondary | ICD-10-CM | POA: Diagnosis not present

## 2019-05-16 DIAGNOSIS — Z125 Encounter for screening for malignant neoplasm of prostate: Secondary | ICD-10-CM | POA: Diagnosis not present

## 2019-05-16 DIAGNOSIS — I10 Essential (primary) hypertension: Secondary | ICD-10-CM | POA: Diagnosis not present

## 2019-05-16 DIAGNOSIS — E7800 Pure hypercholesterolemia, unspecified: Secondary | ICD-10-CM | POA: Diagnosis not present

## 2019-05-16 DIAGNOSIS — E119 Type 2 diabetes mellitus without complications: Secondary | ICD-10-CM | POA: Diagnosis not present

## 2019-05-16 DIAGNOSIS — Z23 Encounter for immunization: Secondary | ICD-10-CM | POA: Diagnosis not present

## 2019-05-21 DIAGNOSIS — R2689 Other abnormalities of gait and mobility: Secondary | ICD-10-CM | POA: Diagnosis not present

## 2019-05-21 DIAGNOSIS — M6281 Muscle weakness (generalized): Secondary | ICD-10-CM | POA: Diagnosis not present

## 2019-06-04 DIAGNOSIS — R2689 Other abnormalities of gait and mobility: Secondary | ICD-10-CM | POA: Diagnosis not present

## 2019-06-04 DIAGNOSIS — M6281 Muscle weakness (generalized): Secondary | ICD-10-CM | POA: Diagnosis not present

## 2019-06-11 DIAGNOSIS — R2689 Other abnormalities of gait and mobility: Secondary | ICD-10-CM | POA: Diagnosis not present

## 2019-06-11 DIAGNOSIS — M6281 Muscle weakness (generalized): Secondary | ICD-10-CM | POA: Diagnosis not present

## 2019-06-30 ENCOUNTER — Other Ambulatory Visit: Payer: Self-pay

## 2019-06-30 ENCOUNTER — Ambulatory Visit (INDEPENDENT_AMBULATORY_CARE_PROVIDER_SITE_OTHER): Payer: Medicare Other | Admitting: Adult Health

## 2019-06-30 ENCOUNTER — Encounter: Payer: Self-pay | Admitting: Adult Health

## 2019-06-30 VITALS — BP 138/80 | HR 72 | Temp 98.0°F | Ht 65.0 in | Wt 237.4 lb

## 2019-06-30 DIAGNOSIS — E785 Hyperlipidemia, unspecified: Secondary | ICD-10-CM

## 2019-06-30 DIAGNOSIS — I6381 Other cerebral infarction due to occlusion or stenosis of small artery: Secondary | ICD-10-CM | POA: Diagnosis not present

## 2019-06-30 DIAGNOSIS — I10 Essential (primary) hypertension: Secondary | ICD-10-CM | POA: Diagnosis not present

## 2019-06-30 NOTE — Patient Instructions (Signed)
Continue aspirin 81 mg daily  and Lipitor for secondary stroke prevention  Continue to follow up with PCP regarding cholesterol and blood pressure management   Continue to monitor blood pressure at home  Maintain strict control of hypertension with blood pressure goal below 130/90, diabetes with hemoglobin A1c goal below 6.5% and cholesterol with LDL cholesterol (bad cholesterol) goal below 70 mg/dL. I also advised the patient to eat a healthy diet with plenty of whole grains, cereals, fruits and vegetables, exercise regularly and maintain ideal body weight.        Thank you for coming to see us at [facility]. I hope we have been able to provide you high quality care today.  You may receive a patient satisfaction survey over the next few weeks. We would appreciate your feedback and comments so that we may continue to improve ourselves and the health of our patients.  

## 2019-06-30 NOTE — Progress Notes (Signed)
[facility] [address] [place]. [place] [address] [phone]       OFFICE CONSULT NOTE  William. William Wolf Date of Birth:  1951-02-07 Medical Record Number:  [id number]   Referring LK:JZPH Liling Reason for Referral:  Stroke    Chief Complaint  Patient presents with  . Follow-up    6 mon f/u. Alone. Rm 9. No new concerns at this time.      HPI: Update 06/30/2019: William Wolf is a 68 year old male who is being seen today for stroke follow-up.  Residual deficits of mild RUE weakness but does endorse improvement from prior visit.  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  Continues on aspirin and atorvastatin for secondary stroke prevention without side effects.  Blood pressure today 138/80.  Recent lab work by PCP which was satisfactory per patient report (unable to personally review).  Denies new or worsening stroke/TIA symptoms.  Update 10/09/2018: William Wolf returns today for follow-up visit.  Overall, he has been stable from a stroke standpoint with residual RUE weakness but does endorse improvement compared to prior visit. He continues to participate in therapy at [facility] with continued exercises and shock treatments.  He is able to perform more activities with his right hand/arm and continues to stay active.  He continues on aspirin without side effects of bleeding or bruising.  Continues on atorvastatin without side effects myalgias.  Blood pressure today 165/101.  He states he does monitor at home and typically 130s/80s but does endorse elevation likely due to driving on 29.  He is asymptomatic with elevated BP.  He continues to decline sleep apnea referral.  Denies new or worsening stroke/TIA symptoms.  HISTORY SUMMARY:  William Wolf is a 54 year Caucasian male seen today for initial office consultation visit for a stroke.  He is accompanied by his wife.  He was admitted to [facility] on  06/12/2018 with a sudden onset of right-sided weakness and numbness.  Initial CT scan of the head was unremarkable but MRI scan of the brain which I personally reviewed in PACS shows a small left coronary radiata lacunar infarct.  CT angiogram of the brain and neck were both obtained and did not show any significant large vessel intracranial or extracranial stenosis.  LDL cholesterol was 98 mg percent.  Hemoglobin A1c was 6.2.  Transthoracic echo showed normal ejection fraction without cardiac source of embolism.  Patient had been noncompliant with aspirin as well as blood pressure medications.  He was started on aspirin and tighter blood pressure control.  [facility].  He has obtain improvement in his right-sided numbness though he still has significant weakness of his right grip and hand.  He is able to ambulate and is finishing therapy and plans to go home this weekend.  He is tolerating aspirin well without bruising or bleeding.  His blood pressure is better controlled and today it is 142/96.  He is also tolerating Lipitor well without muscle aches and pains.  Patient does admit to snoring and feeling tired.  He has not ever been evaluated for sleep apnea yet.  He denies any prior history of strokes TIAs seizures or other significant neurological problems.   ROS:   14 system review of systems is positive for weakness and all other systems negative  PMH:  Past Medical History:  Diagnosis Date  . Hypertension   . Stroke [facility]  Social History:  Social History   Socioeconomic History  . Marital status: Married    Spouse name: Not on file  . Number of children: Not on file  . Years of education: Not on file  . Highest education level: Not on file  Occupational History  . Not on file  Social Needs  . Financial resource strain: Not on file  . Food insecurity    Worry: Not on file    Inability: Not on file  . Transportation needs     Medical: Not on file    Non-medical: Not on file  Tobacco Use  . Smoking status: Never Smoker  . Smokeless tobacco: Never Used  Substance and Sexual Activity  . Alcohol use: Not Currently  . Drug use: Not Currently  . Sexual activity: Not on file  Lifestyle  . Physical activity    Days per week: Not on file    Minutes per session: Not on file  . Stress: Not on file  Relationships  . Social Musician on phone: Not on file    Gets together: Not on file    Attends religious service: Not on file    Active member of club or organization: Not on file    Attends meetings of clubs or organizations: Not on file    Relationship status: Not on file  . Intimate partner violence    Fear of current or ex partner: Not on file    Emotionally abused: Not on file    Physically abused: Not on file    Forced sexual activity: Not on file  Other Topics Concern  . Not on file  Social History Narrative  . Not on file    Medications:   Current Outpatient Medications on File Prior to Visit  Medication Sig Dispense Refill  . acetaminophen (TYLENOL) 325 MG tablet Take 650 mg by mouth every 6 (six) hours as needed.    Marland Kitchen amLODipine (NORVASC) 10 MG tablet TK 1 T PO D FOR HIGH BP  0  . aspirin 325 MG tablet Take 325 mg by mouth daily.    Marland Kitchen atorvastatin (LIPITOR) 80 MG tablet Take 80 mg by mouth daily.    . carvedilol (COREG) 25 MG tablet   4  . LISINOPRIL PO Take 10 mg by mouth.    . Melatonin 5 MG TABS Take by mouth.    . meloxicam (MOBIC) 7.5 MG tablet   0  . nabumetone (RELAFEN) 750 MG tablet   4  . omeprazole (PRILOSEC) 40 MG capsule TAKE 1 CAPSULE BY MOUTH ONCE DAILY FOR GERD/REFLUX  0  . Sennosides (SENOKOT PO) Take 8.6 mg by mouth.    . traMADol (ULTRAM) 50 MG tablet Take by mouth every 6 (six) hours as needed.     No current facility-administered medications on file prior to visit.     Allergies:  No Known Allergies  Today's Vitals   06/30/19 1230  BP: 138/80  Pulse: 72   Temp: 98 F (36.7 C)  TempSrc: Oral  Weight: 237 lb 6.4 oz (107.7 kg)  Height: 5\' 5"  (1.651 m)   Body mass index is 39.51 kg/m.   Physical Exam General: Obese pleasant middle-aged male seated, in no evident distress Head: head normocephalic and atraumatic.   Neck: supple with no carotid or supraclavicular bruits Cardiovascular: regular rate and rhythm, no murmurs Musculoskeletal: no deformity  Skin:  no rash/petichiae Vascular:  Normal pulses all extremities  Neurologic Exam Mental Status:  Awake and fully alert. Oriented to place and time. Recent and remote memory intact. Attention span, concentration and fund of knowledge appropriate. Mood and affect appropriate.  Cranial Nerves: Pupils equal, briskly reactive to light. Extraocular movements full without nystagmus. Visual fields full to confrontation. Hearing intact. Facial sensation intact.  Mild right lower facial asymmetry., tongue, palate moves normally and symmetrically.  Motor: Normal bulk and tone. RUE: 4+/5 with weak grip strength RLE: 5/5 LUE: 5/5 LLE: 5/5 Sensory.: intact to touch , pinprick , position and vibratory sensation.  Coordination: decreased right hand dexterity  Gait and Station: Arises from chair without difficulty. Stance is normal. Gait demonstrates hemiplegic gait but ambulates without assistive device Reflexes: 1+ and symmetric. Toes downgoing.     ASSESSMENT: 68 year old male with left subcortical lacunar infarct in October 2019 secondary to small vessel disease.  Vascular risk factors of hypertension, hyperlipidemia, and obesity. Residual deficits of mild RUE weakness.     PLAN: -Continue aspirin 81 mg and atorvastatin for secondary stroke prevention -Continue to follow with PCP for HTN and HLD management -Monitor BP at home -Maintain strict control of hypertension with blood pressure goal below 130/90, diabetes with hemoglobin A1c goal below 6.5% and cholesterol with LDL cholesterol (bad  cholesterol) goal below 70 mg/dL. I also advised the patient to eat a healthy diet with plenty of whole grains, cereals, fruits and vegetables, exercise regularly and maintain ideal body weight.  Stable from stroke standpoint and recommend follow up as needed  Greater than 50% time during this 25-minute visit was spent on counseling and coordination of care about his stroke and answering questions.     Ihor AustinJessica McCue, AGNP-BC  [facility] [address] [place], [place] [address]  Phone [phone] Fax [phone] Note: This document was prepared with digital dictation and possible smart phrase technology. Any transcriptional errors that result from this process are unintentional.

## 2019-07-01 NOTE — Progress Notes (Signed)
I agree with the above plan 

## 2019-07-24 DIAGNOSIS — L820 Inflamed seborrheic keratosis: Secondary | ICD-10-CM | POA: Diagnosis not present

## 2019-07-24 DIAGNOSIS — M7501 Adhesive capsulitis of right shoulder: Secondary | ICD-10-CM | POA: Diagnosis not present

## 2019-07-24 DIAGNOSIS — J309 Allergic rhinitis, unspecified: Secondary | ICD-10-CM | POA: Diagnosis not present

## 2019-07-24 DIAGNOSIS — M79641 Pain in right hand: Secondary | ICD-10-CM | POA: Diagnosis not present

## 2019-07-28 DIAGNOSIS — M25611 Stiffness of right shoulder, not elsewhere classified: Secondary | ICD-10-CM | POA: Diagnosis not present

## 2019-07-28 DIAGNOSIS — M6281 Muscle weakness (generalized): Secondary | ICD-10-CM | POA: Diagnosis not present

## 2019-08-05 DIAGNOSIS — M25611 Stiffness of right shoulder, not elsewhere classified: Secondary | ICD-10-CM | POA: Diagnosis not present

## 2019-08-05 DIAGNOSIS — M6281 Muscle weakness (generalized): Secondary | ICD-10-CM | POA: Diagnosis not present

## 2019-08-13 DIAGNOSIS — M6281 Muscle weakness (generalized): Secondary | ICD-10-CM | POA: Diagnosis not present

## 2019-08-13 DIAGNOSIS — M25611 Stiffness of right shoulder, not elsewhere classified: Secondary | ICD-10-CM | POA: Diagnosis not present

## 2019-09-18 DIAGNOSIS — H2513 Age-related nuclear cataract, bilateral: Secondary | ICD-10-CM | POA: Diagnosis not present

## 2019-09-18 DIAGNOSIS — H5203 Hypermetropia, bilateral: Secondary | ICD-10-CM | POA: Diagnosis not present

## 2019-09-18 DIAGNOSIS — H52223 Regular astigmatism, bilateral: Secondary | ICD-10-CM | POA: Diagnosis not present

## 2019-09-29 DIAGNOSIS — M6281 Muscle weakness (generalized): Secondary | ICD-10-CM | POA: Diagnosis not present

## 2019-09-29 DIAGNOSIS — M25611 Stiffness of right shoulder, not elsewhere classified: Secondary | ICD-10-CM | POA: Diagnosis not present

## 2019-10-06 DIAGNOSIS — I10 Essential (primary) hypertension: Secondary | ICD-10-CM | POA: Diagnosis not present

## 2019-10-06 DIAGNOSIS — M7501 Adhesive capsulitis of right shoulder: Secondary | ICD-10-CM | POA: Diagnosis not present

## 2019-10-06 DIAGNOSIS — Z6841 Body Mass Index (BMI) 40.0 and over, adult: Secondary | ICD-10-CM | POA: Diagnosis not present

## 2019-10-15 DIAGNOSIS — M19019 Primary osteoarthritis, unspecified shoulder: Secondary | ICD-10-CM | POA: Diagnosis not present

## 2019-10-22 DIAGNOSIS — M25611 Stiffness of right shoulder, not elsewhere classified: Secondary | ICD-10-CM | POA: Diagnosis not present

## 2019-10-22 DIAGNOSIS — M6281 Muscle weakness (generalized): Secondary | ICD-10-CM | POA: Diagnosis not present

## 2019-10-27 DIAGNOSIS — M25611 Stiffness of right shoulder, not elsewhere classified: Secondary | ICD-10-CM | POA: Diagnosis not present

## 2019-10-27 DIAGNOSIS — M6281 Muscle weakness (generalized): Secondary | ICD-10-CM | POA: Diagnosis not present

## 2019-11-03 DIAGNOSIS — M6281 Muscle weakness (generalized): Secondary | ICD-10-CM | POA: Diagnosis not present

## 2019-11-03 DIAGNOSIS — M25611 Stiffness of right shoulder, not elsewhere classified: Secondary | ICD-10-CM | POA: Diagnosis not present

## 2019-11-17 DIAGNOSIS — I69359 Hemiplegia and hemiparesis following cerebral infarction affecting unspecified side: Secondary | ICD-10-CM | POA: Diagnosis not present

## 2019-11-17 DIAGNOSIS — I10 Essential (primary) hypertension: Secondary | ICD-10-CM | POA: Diagnosis not present

## 2019-11-17 DIAGNOSIS — E119 Type 2 diabetes mellitus without complications: Secondary | ICD-10-CM | POA: Diagnosis not present

## 2019-11-17 DIAGNOSIS — E7800 Pure hypercholesterolemia, unspecified: Secondary | ICD-10-CM | POA: Diagnosis not present

## 2020-01-09 DIAGNOSIS — I152 Hypertension secondary to endocrine disorders: Secondary | ICD-10-CM | POA: Diagnosis not present

## 2020-01-09 DIAGNOSIS — Z6838 Body mass index (BMI) 38.0-38.9, adult: Secondary | ICD-10-CM | POA: Diagnosis not present

## 2020-01-09 DIAGNOSIS — E1159 Type 2 diabetes mellitus with other circulatory complications: Secondary | ICD-10-CM | POA: Diagnosis not present

## 2020-01-09 DIAGNOSIS — M7061 Trochanteric bursitis, right hip: Secondary | ICD-10-CM | POA: Diagnosis not present

## 2020-04-14 DIAGNOSIS — M19011 Primary osteoarthritis, right shoulder: Secondary | ICD-10-CM | POA: Diagnosis not present

## 2020-04-14 DIAGNOSIS — Z6838 Body mass index (BMI) 38.0-38.9, adult: Secondary | ICD-10-CM | POA: Diagnosis not present

## 2020-04-14 DIAGNOSIS — M25511 Pain in right shoulder: Secondary | ICD-10-CM | POA: Diagnosis not present

## 2020-04-19 DIAGNOSIS — M25511 Pain in right shoulder: Secondary | ICD-10-CM | POA: Diagnosis not present

## 2020-04-19 DIAGNOSIS — M25611 Stiffness of right shoulder, not elsewhere classified: Secondary | ICD-10-CM | POA: Diagnosis not present

## 2020-04-19 DIAGNOSIS — I69398 Other sequelae of cerebral infarction: Secondary | ICD-10-CM | POA: Diagnosis not present

## 2020-04-19 DIAGNOSIS — M6281 Muscle weakness (generalized): Secondary | ICD-10-CM | POA: Diagnosis not present

## 2020-04-21 DIAGNOSIS — M25511 Pain in right shoulder: Secondary | ICD-10-CM | POA: Diagnosis not present

## 2020-04-21 DIAGNOSIS — I69398 Other sequelae of cerebral infarction: Secondary | ICD-10-CM | POA: Diagnosis not present

## 2020-04-21 DIAGNOSIS — M25611 Stiffness of right shoulder, not elsewhere classified: Secondary | ICD-10-CM | POA: Diagnosis not present

## 2020-04-21 DIAGNOSIS — M6281 Muscle weakness (generalized): Secondary | ICD-10-CM | POA: Diagnosis not present

## 2020-04-26 DIAGNOSIS — M6281 Muscle weakness (generalized): Secondary | ICD-10-CM | POA: Diagnosis not present

## 2020-04-26 DIAGNOSIS — M25611 Stiffness of right shoulder, not elsewhere classified: Secondary | ICD-10-CM | POA: Diagnosis not present

## 2020-04-26 DIAGNOSIS — M25511 Pain in right shoulder: Secondary | ICD-10-CM | POA: Diagnosis not present

## 2020-04-26 DIAGNOSIS — I69398 Other sequelae of cerebral infarction: Secondary | ICD-10-CM | POA: Diagnosis not present

## 2020-05-20 DIAGNOSIS — I69359 Hemiplegia and hemiparesis following cerebral infarction affecting unspecified side: Secondary | ICD-10-CM | POA: Diagnosis not present

## 2020-05-20 DIAGNOSIS — Z9181 History of falling: Secondary | ICD-10-CM | POA: Diagnosis not present

## 2020-05-20 DIAGNOSIS — Z125 Encounter for screening for malignant neoplasm of prostate: Secondary | ICD-10-CM | POA: Diagnosis not present

## 2020-05-20 DIAGNOSIS — Z23 Encounter for immunization: Secondary | ICD-10-CM | POA: Diagnosis not present

## 2020-05-20 DIAGNOSIS — E7800 Pure hypercholesterolemia, unspecified: Secondary | ICD-10-CM | POA: Diagnosis not present

## 2020-05-20 DIAGNOSIS — I152 Hypertension secondary to endocrine disorders: Secondary | ICD-10-CM | POA: Diagnosis not present

## 2020-05-20 DIAGNOSIS — M8949 Other hypertrophic osteoarthropathy, multiple sites: Secondary | ICD-10-CM | POA: Diagnosis not present

## 2020-05-20 DIAGNOSIS — Z6838 Body mass index (BMI) 38.0-38.9, adult: Secondary | ICD-10-CM | POA: Diagnosis not present

## 2020-05-20 DIAGNOSIS — E1169 Type 2 diabetes mellitus with other specified complication: Secondary | ICD-10-CM | POA: Diagnosis not present

## 2020-05-20 DIAGNOSIS — Z1331 Encounter for screening for depression: Secondary | ICD-10-CM | POA: Diagnosis not present

## 2020-05-20 DIAGNOSIS — E785 Hyperlipidemia, unspecified: Secondary | ICD-10-CM | POA: Diagnosis not present

## 2020-05-20 DIAGNOSIS — E1159 Type 2 diabetes mellitus with other circulatory complications: Secondary | ICD-10-CM | POA: Diagnosis not present

## 2020-06-03 DIAGNOSIS — S93402A Sprain of unspecified ligament of left ankle, initial encounter: Secondary | ICD-10-CM | POA: Diagnosis not present

## 2020-08-23 DIAGNOSIS — E7800 Pure hypercholesterolemia, unspecified: Secondary | ICD-10-CM | POA: Diagnosis not present

## 2020-08-23 DIAGNOSIS — T466X5A Adverse effect of antihyperlipidemic and antiarteriosclerotic drugs, initial encounter: Secondary | ICD-10-CM | POA: Diagnosis not present

## 2020-08-23 DIAGNOSIS — M25511 Pain in right shoulder: Secondary | ICD-10-CM | POA: Diagnosis not present

## 2020-08-23 DIAGNOSIS — M791 Myalgia, unspecified site: Secondary | ICD-10-CM | POA: Diagnosis not present

## 2020-08-23 DIAGNOSIS — R29898 Other symptoms and signs involving the musculoskeletal system: Secondary | ICD-10-CM | POA: Diagnosis not present

## 2020-08-23 DIAGNOSIS — Z6838 Body mass index (BMI) 38.0-38.9, adult: Secondary | ICD-10-CM | POA: Diagnosis not present

## 2020-09-01 ENCOUNTER — Encounter: Payer: Self-pay | Admitting: Physical Medicine & Rehabilitation

## 2020-09-13 DIAGNOSIS — Z139 Encounter for screening, unspecified: Secondary | ICD-10-CM | POA: Diagnosis not present

## 2020-09-13 DIAGNOSIS — E7800 Pure hypercholesterolemia, unspecified: Secondary | ICD-10-CM | POA: Diagnosis not present

## 2020-09-13 DIAGNOSIS — I152 Hypertension secondary to endocrine disorders: Secondary | ICD-10-CM | POA: Diagnosis not present

## 2020-09-13 DIAGNOSIS — R0982 Postnasal drip: Secondary | ICD-10-CM | POA: Diagnosis not present

## 2020-09-13 DIAGNOSIS — E785 Hyperlipidemia, unspecified: Secondary | ICD-10-CM | POA: Diagnosis not present

## 2020-09-13 DIAGNOSIS — E1169 Type 2 diabetes mellitus with other specified complication: Secondary | ICD-10-CM | POA: Diagnosis not present

## 2020-09-13 DIAGNOSIS — E1159 Type 2 diabetes mellitus with other circulatory complications: Secondary | ICD-10-CM | POA: Diagnosis not present

## 2020-09-13 DIAGNOSIS — I69359 Hemiplegia and hemiparesis following cerebral infarction affecting unspecified side: Secondary | ICD-10-CM | POA: Diagnosis not present

## 2020-09-27 DIAGNOSIS — Z20822 Contact with and (suspected) exposure to covid-19: Secondary | ICD-10-CM | POA: Diagnosis not present

## 2020-09-27 DIAGNOSIS — Z6838 Body mass index (BMI) 38.0-38.9, adult: Secondary | ICD-10-CM | POA: Diagnosis not present

## 2020-10-08 ENCOUNTER — Other Ambulatory Visit: Payer: Self-pay

## 2020-10-08 ENCOUNTER — Encounter: Payer: Self-pay | Admitting: Physical Medicine & Rehabilitation

## 2020-10-08 ENCOUNTER — Encounter: Payer: Medicare Other | Attending: Physical Medicine & Rehabilitation | Admitting: Physical Medicine & Rehabilitation

## 2020-10-08 VITALS — BP 132/77 | HR 72 | Temp 99.0°F | Ht 65.0 in | Wt 238.0 lb

## 2020-10-08 DIAGNOSIS — M7501 Adhesive capsulitis of right shoulder: Secondary | ICD-10-CM | POA: Diagnosis not present

## 2020-10-08 DIAGNOSIS — M7061 Trochanteric bursitis, right hip: Secondary | ICD-10-CM | POA: Insufficient documentation

## 2020-10-08 DIAGNOSIS — G8320 Monoplegia of upper limb affecting unspecified side: Secondary | ICD-10-CM | POA: Diagnosis not present

## 2020-10-08 NOTE — Progress Notes (Signed)
Subjective:    Patient ID: William Wolf, male    DOB: [birth year], 70 y.o.   MRN: [id number]  HPI 70 year old male who was admitted to [facility] on 06/12/2018 with sudden onset right-sided weakness.  Initial CT scan was unremarkable MRI demonstrated left corona radiata infarct.  The patient went to [facility] skilled nursing facility after acute hospitalization.  Referred by neurology for the evaluation of chronic right shoulder pain.  Has been treated by primary care for adhesive capsulitis.  Shoulder pain level is 8/10 interferes with activity at 4/10 level.  Sleep is fair.  Relief from medications is just a little i.e. 2/10 relief his hip pain is worse with walking and standing shoulder pain with overhead activity   Pt c/o RIght hand grip weakness also Right arm goes numb when it sits there for a while  Right shoulder pain movement, once arm gets to shoulder level.  Pt unable to take steroids due to shortness of breath.  Has taken tylenol and BC arthritis for pain Had outpt PT at [facility], which gave some relief of his shoulder pain. The patient is a avid gardener and feels like his right upper extremity problems are impeding his ability to perform his hobby.  Patient hopes to get better by June when he has lots of gardening activities.  Walking tolerance 5 minutes does not climb steps Exercise history sleeps around 8 hours a day sits around 6 to 8 hours a day stands as little as possible perhaps 1 hour total per day walking perhaps 1 hour/day Mod I dressing and bathing, uses Left and for eating and driving   Pain Inventory Average Pain 8 Pain Right Now 8 My pain is constant, stabbing and aching  In the last 24 hours, has pain interfered with the following? General activity 4 Relation with others 0 Enjoyment of life 4 What TIME of day is your pain at its worst? varies Sleep (in general) Fair  Pain is worse with: walking, standing and some activites Pain improves with:  medication Relief from Meds: 2  walk without assistance how many minutes can you walk? 5 ability to climb steps?  no do you drive?  yes  retired  bladder control problems trouble walking  New pt  New pt    No family history on file. Social History   Socioeconomic History  . Marital status: Married    Spouse name: Not on file  . Number of children: Not on file  . Years of education: Not on file  . Highest education level: Not on file  Occupational History  . Not on file  Tobacco Use  . Smoking status: Never Smoker  . Smokeless tobacco: Never Used  Substance and Sexual Activity  . Alcohol use: Not Currently  . Drug use: Not Currently  . Sexual activity: Not on file  Other Topics Concern  . Not on file  Social History Narrative  . Not on file   Social Determinants of Health   Financial Resource Strain: Not on file  Food Insecurity: Not on file  Transportation Needs: Not on file  Physical Activity: Not on file  Stress: Not on file  Social Connections: Not on file   Past Surgical History:  Procedure Laterality Date  . right knee sugery     Past Medical History:  Diagnosis Date  . Hypertension   . Stroke (HCC)    BP 132/77   Pulse 72   Temp 99 F (37.2 C)  Ht 5\' 5"  (1.651 m)   Wt 238 lb (108 kg)   SpO2 93%   BMI 39.61 kg/m   Opioid Risk Score:   Fall Risk Score:  `1  Depression screen PHQ 2/9  Depression screen PHQ 2/9 10/08/2020  Decreased Interest 0  Down, Depressed, Hopeless 0  PHQ - 2 Score 0  Altered sleeping 0  Tired, decreased energy 1  Change in appetite 0  Feeling bad or failure about yourself  0  Trouble concentrating 0  Moving slowly or fidgety/restless 0  Suicidal thoughts 0  PHQ-9 Score 1  Difficult doing work/chores Not difficult at all    Review of Systems  Musculoskeletal:       Shoulder pain Hand pain Hip pain  All other systems reviewed and are negative.      Objective:   Physical Exam Vitals and nursing  note reviewed.  Constitutional:      General: He is not in acute distress.    Appearance: He is obese.  HENT:     Head: Normocephalic and atraumatic.  Eyes:     Extraocular Movements: Extraocular movements intact.     Conjunctiva/sclera: Conjunctivae normal.     Pupils: Pupils are equal, round, and reactive to light.  Cardiovascular:     Rate and Rhythm: Normal rate and regular rhythm.     Heart sounds: Normal heart sounds.  Pulmonary:     Effort: Pulmonary effort is normal. No respiratory distress.     Breath sounds: Normal breath sounds. No wheezing.  Abdominal:     General: Abdomen is flat. Bowel sounds are normal. There is no distension.     Palpations: Abdomen is soft.  Musculoskeletal:     Cervical back: No rigidity.     Comments: No pain to palpation around the right shoulder There is limited right shoulder abduction to 90 degrees forward flexion and 90 degrees and external rotation to 20 degrees.  End range is accompanied by pain.  No pain in the elbow wrist or hand.  No pain with MCP compression There is tenderness over the right greater trochanter of the hip.  Skin:    General: Skin is warm and dry.  Neurological:     General: No focal deficit present.     Mental Status: He is alert and oriented to person, place, and time.     Comments: Motor strength is 4/5 within available range in the right deltoid 4/5 in the right bicep tricep finger flexors and extensors 5/5 in the right hip flexor knee extensor ankle dorsiflexor Left upper extremity and left lower extremity 5/5  Psychiatric:        Mood and Affect: Mood normal.        Behavior: Behavior normal.        Thought Content: Thought content normal.        Judgment: Judgment normal.    Tone is increased in the right FPL as well as right wrist flexors and right index finger flexor both at the DIP joint and at the PIP joint. MAS 2/3 in the finger and thumb flexor as well as the wrist flexors.       Assessment &  Plan:  #1.  History of left corona radiata infarct approximately 2 years ago with chronic right hemiparesis. We discussed that his hand strength cannot be improved with botulinum toxin but needs to continue with therapy or at least a home program for this.  His tone problems can be addressed by low-dose Botox.  We discussed that it would not work for about a week.  The goal would be to decrease stiffness in the hand Botox  FCR 25 U FDP25U to index FDS 25U to index FPL 25U   #2.  Right shoulder pain adhesive capsulitis, we discussed treatment options including suprascapular nerve block under ultrasound guidance and if this gives only a temporary effect could proceed onto cryoablation or radiofrequency neurotomy of the nerve. Alternatively he could be referred to orthopedics for range of motion under general anesthesia  #3.  Right hip adhesive capsulitis.  Advise Voltaren gel for this.  Also exercise.  Given his problem with corticosteroid injections would not do any injection to the right hip.

## 2020-10-08 NOTE — Patient Instructions (Addendum)
Botox 100U for hand stiffness  THen will turn attention to RIght shoulder   Voltaren gel 4 times a day to the right hip Hip Bursitis Rehab Ask your health care provider which exercises are safe for you. Do exercises exactly as told by your health care provider and adjust them as directed. It is normal to feel mild stretching, pulling, tightness, or discomfort as you do these exercises. Stop right away if you feel sudden pain or your pain gets worse. Do not begin these exercises until told by your health care provider. Stretching exercise This exercise warms up your muscles and joints and improves the movement and flexibility of your hip. This exercise also helps to relieve pain and stiffness. Iliotibial band stretch An iliotibial band is a strong band of muscle tissue that runs from the outer side of your hip to the outer side of your thigh and knee. 1. Lie on your side with your left / right leg in the top position. 2. Bend your left / right knee and grab your ankle. Stretch out your bottom arm to help you balance. 3. Slowly bring your knee back so your thigh is behind your body. 4. Slowly lower your knee toward the floor until you feel a gentle stretch on the outside of your left / right thigh. If you do not feel a stretch and your knee will not fall farther, place the heel of your other foot on top of your knee and pull your knee down toward the floor with your foot. 5. Hold this position for __________ seconds. 6. Slowly return to the starting position. Repeat __________ times. Complete this exercise __________ times a day.   Strengthening exercises These exercises build strength and endurance in your hip and pelvis. Endurance is the ability to use your muscles for a long time, even after they get tired. Bridge This exercise strengthens the muscles that move your thigh backward (hip extensors). 1. Lie on your back on a firm surface with your knees bent and your feet flat on the  floor. 2. Tighten your buttocks muscles and lift your buttocks off the floor until your trunk is level with your thighs. ? Do not arch your back. ? You should feel the muscles working in your buttocks and the back of your thighs. If you do not feel these muscles, slide your feet 1-2 inches (2.5-5 cm) farther away from your buttocks. ? If this exercise is too easy, try doing it with your arms crossed over your chest. 3. Hold this position for __________ seconds. 4. Slowly lower your hips to the starting position. 5. Let your muscles relax completely after each repetition. Repeat __________ times. Complete this exercise __________ times a day.   Squats This exercise strengthens the muscles in front of your thigh and knee (quadriceps). 1. Stand in front of a table, with your feet and knees pointing straight ahead. You may rest your hands on the table for balance but not for support. 2. Slowly bend your knees and lower your hips like you are going to sit in a chair. ? Keep your weight over your heels, not over your toes. ? Keep your lower legs upright so they are parallel with the table legs. ? Do not let your hips go lower than your knees. ? Do not bend lower than told by your health care provider. ? If your hip pain increases, do not bend as low. 3. Hold the squat position for __________ seconds. 4. Slowly push with your  legs to return to standing. Do not use your hands to pull yourself to standing. Repeat __________ times. Complete this exercise __________ times a day. Hip hike 1. Stand sideways on a bottom step. Stand on your left / right leg with your other foot unsupported next to the step. You can hold on to the railing or wall for balance if needed. 2. Keep your knees straight and your torso square. Then lift your left / right hip up toward the ceiling. 3. Hold this position for __________ seconds. 4. Slowly let your left / right hip lower toward the floor, past the starting position.  Your foot should get closer to the floor. Do not lean or bend your knees. Repeat __________ times. Complete this exercise __________ times a day. Single leg stand 1. Without shoes, stand near a railing or in a doorway. You may hold on to the railing or door frame as needed for balance. 2. Squeeze your left / right buttock muscles, then lift up your other foot. ? Do not let your left / right hip push out to the side. ? It is helpful to stand in front of a mirror for this exercise so you can watch your hip. 3. Hold this position for __________ seconds. Repeat __________ times. Complete this exercise __________ times a day. This information is not intended to replace advice given to you by your health care provider. Make sure you discuss any questions you have with your health care provider. Document Revised: 12/16/2018 Document Reviewed: 12/16/2018 Elsevier Patient Education  [place].

## 2020-10-28 ENCOUNTER — Other Ambulatory Visit: Payer: Self-pay

## 2020-10-28 ENCOUNTER — Encounter: Payer: Self-pay | Admitting: Physical Medicine & Rehabilitation

## 2020-10-28 ENCOUNTER — Encounter (HOSPITAL_BASED_OUTPATIENT_CLINIC_OR_DEPARTMENT_OTHER): Payer: Medicare Other | Admitting: Physical Medicine & Rehabilitation

## 2020-10-28 VITALS — BP 163/82 | HR 72 | Temp 98.6°F | Ht 65.0 in | Wt 238.0 lb

## 2020-10-28 DIAGNOSIS — M7061 Trochanteric bursitis, right hip: Secondary | ICD-10-CM | POA: Diagnosis not present

## 2020-10-28 DIAGNOSIS — G8320 Monoplegia of upper limb affecting unspecified side: Secondary | ICD-10-CM

## 2020-10-28 DIAGNOSIS — M7501 Adhesive capsulitis of right shoulder: Secondary | ICD-10-CM | POA: Diagnosis not present

## 2020-10-28 NOTE — Progress Notes (Signed)
Botox Injection for spasticity using needle EMG guidance  Dilution: 50 Units/ml Indication: Severe spasticity which interferes with ADL,mobility and/or  hygiene and is unresponsive to medication management and other conservative care Informed consent was obtained after describing risks and benefits of the procedure with the patient. This includes bleeding, bruising, infection, excessive weakness, or medication side effects. A REMS form is on file and signed. Needle: 27g 1" needle electrode Number of units per muscle FCR 25 U FDP25U to index FDS 25U to index FPL 25U  All injections were done after obtaining appropriate EMG activity and after negative drawback for blood. The patient tolerated the procedure well. Post procedure instructions were given. A followup appointment was made.

## 2020-10-28 NOTE — Patient Instructions (Signed)

## 2020-11-18 DIAGNOSIS — Z9181 History of falling: Secondary | ICD-10-CM | POA: Diagnosis not present

## 2020-11-18 DIAGNOSIS — E785 Hyperlipidemia, unspecified: Secondary | ICD-10-CM | POA: Diagnosis not present

## 2020-11-18 DIAGNOSIS — Z1331 Encounter for screening for depression: Secondary | ICD-10-CM | POA: Diagnosis not present

## 2020-11-18 DIAGNOSIS — E669 Obesity, unspecified: Secondary | ICD-10-CM | POA: Diagnosis not present

## 2020-11-18 DIAGNOSIS — Z0000 Encounter for general adult medical examination without abnormal findings: Secondary | ICD-10-CM | POA: Diagnosis not present

## 2020-11-19 ENCOUNTER — Other Ambulatory Visit: Payer: Self-pay

## 2020-11-19 ENCOUNTER — Encounter: Payer: Medicare Other | Attending: Physical Medicine & Rehabilitation | Admitting: Physical Medicine & Rehabilitation

## 2020-11-19 ENCOUNTER — Encounter: Payer: Self-pay | Admitting: Physical Medicine & Rehabilitation

## 2020-11-19 VITALS — BP 162/96 | HR 78 | Temp 98.7°F | Ht 65.0 in | Wt 234.0 lb

## 2020-11-19 DIAGNOSIS — G8929 Other chronic pain: Secondary | ICD-10-CM | POA: Diagnosis not present

## 2020-11-19 DIAGNOSIS — M7501 Adhesive capsulitis of right shoulder: Secondary | ICD-10-CM

## 2020-11-19 DIAGNOSIS — M25511 Pain in right shoulder: Secondary | ICD-10-CM | POA: Diagnosis not present

## 2020-11-19 NOTE — Patient Instructions (Signed)
Right suprascapular nerve block today , Repeat botox next

## 2020-11-19 NOTE — Progress Notes (Signed)
Suprascapular nerve block under ultrasound guidance Indication shoulder pain which is not relieved by analgesic medications or other conservative care. Pain is severe and interferes with functional activities. Informed consent was obtained after describing risks and benefits of the procedure with patient these include bleeding bruising and infection, the patient elected to proceed and has given written consent. Patient placed in a seated position with ipsilateral hand on opposite shoulder. Linear transducer placed along the spine of the scapula and moved superiorly into the suprascapular fossa Area marked and prepped with Betadine 25-gauge 1.5 inch needle inserted, 2 cc of 1% lidocaine infiltrated 22-gauge 80 mm echo block needle inserted under direct ultrasound visualization. Suprascapular not identified. Doppler used to identify blood vessels. Needle inserted into the notch.  4 ML 0.25% bupivacaine  Injected  Patient tolerated procedure well. Post procedure instructions given Return to clinic one month

## 2020-12-10 ENCOUNTER — Encounter: Payer: Self-pay | Admitting: Physical Medicine & Rehabilitation

## 2020-12-10 ENCOUNTER — Other Ambulatory Visit: Payer: Self-pay

## 2020-12-10 ENCOUNTER — Ambulatory Visit
Admission: RE | Admit: 2020-12-10 | Discharge: 2020-12-10 | Disposition: A | Discharge status: Home / Self Care | Payer: Medicare Other | Source: Ambulatory Visit | Attending: Physical Medicine & Rehabilitation | Admitting: Physical Medicine & Rehabilitation

## 2020-12-10 ENCOUNTER — Encounter: Payer: Medicare Other | Attending: Physical Medicine & Rehabilitation | Admitting: Physical Medicine & Rehabilitation

## 2020-12-10 VITALS — BP 164/91 | HR 70 | Temp 98.0°F | Ht 65.0 in | Wt 235.0 lb

## 2020-12-10 DIAGNOSIS — M25751 Osteophyte, right hip: Secondary | ICD-10-CM | POA: Diagnosis not present

## 2020-12-10 DIAGNOSIS — M25551 Pain in right hip: Secondary | ICD-10-CM | POA: Diagnosis not present

## 2020-12-10 DIAGNOSIS — M1611 Unilateral primary osteoarthritis, right hip: Secondary | ICD-10-CM | POA: Diagnosis not present

## 2020-12-10 MED ORDER — MELOXICAM 7.5 MG PO TABS
7.5000 mg | ORAL_TABLET | Freq: Every day | ORAL | 0 refills | Status: DC
Start: 2020-12-10 — End: 2020-12-10

## 2020-12-10 MED ORDER — MELOXICAM 7.5 MG PO TABS: 7.5000 mg | ORAL_TABLET | Freq: Every day | ORAL | 0 refills | Status: DC

## 2020-12-10 NOTE — Progress Notes (Signed)
   Subjective:    Patient ID: William Wolf, male    DOB: [birth year], 70 y.o.   MRN: [id number]  HPI  Pain Inventory Average Pain 9 Pain Right Now 9 My pain is constant and aching  In the last 24 hours, has pain interfered with the following? General activity 4 Relation with others 2 Enjoyment of life 2 What TIME of day is your pain at its worst? varies Sleep (in general) varies  Pain is worse with: walking Pain improves with: rest and medication Relief from Meds: 2  History reviewed. No pertinent family history. Social History   Socioeconomic History  . Marital status: Married    Spouse name: Not on file  . Number of children: Not on file  . Years of education: Not on file  . Highest education level: Not on file  Occupational History  . Not on file  Tobacco Use  . Smoking status: Never Smoker  . Smokeless tobacco: Never Used  Substance and Sexual Activity  . Alcohol use: Not Currently  . Drug use: Not Currently  . Sexual activity: Not on file  Other Topics Concern  . Not on file  Social History Narrative  . Not on file   Social Determinants of Health   Financial Resource Strain: Not on file  Food Insecurity: Not on file  Transportation Needs: Not on file  Physical Activity: Not on file  Stress: Not on file  Social Connections: Not on file   Past Surgical History:  Procedure Laterality Date  . right knee sugery     Past Surgical History:  Procedure Laterality Date  . right knee sugery     Past Medical History:  Diagnosis Date  . Hypertension   . Stroke (HCC)    BP (!) 164/91   Pulse 70   Temp 98 F (36.7 C)   Ht 5\' 5"  (1.651 m)   Wt 235 lb (106.6 kg)   SpO2 94%   BMI 39.11 kg/m   Opioid Risk Score:   Fall Risk Score:  `1  Depression screen PHQ 2/9  Depression screen PHQ 2/9 10/08/2020  Decreased Interest 0  Down, Depressed, Hopeless 0  PHQ - 2 Score 0  Altered sleeping 0  Tired, decreased energy 1  Change in appetite 0  Feeling bad  or failure about yourself  0  Trouble concentrating 0  Moving slowly or fidgety/restless 0  Suicidal thoughts 0  PHQ-9 Score 1  Difficult doing work/chores Not difficult at all    Review of Systems  Constitutional: Negative.   HENT: Negative.   Eyes: Negative.   Respiratory: Negative.   Cardiovascular: Negative.   Gastrointestinal: Negative.   Endocrine: Negative.   Genitourinary: Negative.   Musculoskeletal: Positive for arthralgias and gait problem.  Skin: Negative.   Allergic/Immunologic: Negative.   Hematological: Negative.   Psychiatric/Behavioral: Negative.   All other systems reviewed and are negative.      Objective:   Physical Exam        Assessment & Plan:

## 2020-12-10 NOTE — Patient Instructions (Signed)
[facility] [address]

## 2021-02-01 ENCOUNTER — Encounter: Payer: Medicare Other | Admitting: Physical Medicine & Rehabilitation

## 2021-03-08 ENCOUNTER — Other Ambulatory Visit: Payer: Self-pay

## 2021-03-08 ENCOUNTER — Encounter: Payer: Self-pay | Admitting: Physical Medicine & Rehabilitation

## 2021-03-08 ENCOUNTER — Encounter: Payer: Medicare Other | Attending: Physical Medicine & Rehabilitation | Admitting: Physical Medicine & Rehabilitation

## 2021-03-08 VITALS — BP 137/84 | HR 77 | Temp 98.5°F | Wt 237.0 lb

## 2021-03-08 DIAGNOSIS — M1611 Unilateral primary osteoarthritis, right hip: Secondary | ICD-10-CM | POA: Diagnosis not present

## 2021-03-08 DIAGNOSIS — G8929 Other chronic pain: Secondary | ICD-10-CM | POA: Diagnosis not present

## 2021-03-08 DIAGNOSIS — M25511 Pain in right shoulder: Secondary | ICD-10-CM | POA: Diagnosis not present

## 2021-03-08 DIAGNOSIS — M7501 Adhesive capsulitis of right shoulder: Secondary | ICD-10-CM | POA: Insufficient documentation

## 2021-03-08 DIAGNOSIS — G8320 Monoplegia of upper limb affecting unspecified side: Secondary | ICD-10-CM | POA: Diagnosis not present

## 2021-03-08 NOTE — Patient Instructions (Signed)
Recommend right shoulder range of motion under general anesthesia to help with adhesive capsulitis.  Recommend orthopedic evaluation for potential right hip total arthroplasty. Patient would like this to be done close to home in [place].  The patient has an allergy to dexamethasone.  No corticosteroid injections.

## 2021-03-08 NOTE — Progress Notes (Signed)
Botox Injection for spasticity using needle EMG guidance  Dilution: 50 Units/ml Indication: Severe spasticity which interferes with ADL,mobility and/or  hygiene and is unresponsive to medication management and other conservative care Informed consent was obtained after describing risks and benefits of the procedure with the patient. This includes bleeding, bruising, infection, excessive weakness, or medication side effects. A REMS form is on file and signed. Needle: 27g 1" needle electrode Number of units per muscle FCR 25 U FDP25U to index FDS 25U to index FPL 25U  All injections were done after obtaining appropriate EMG activity and after negative drawback for blood. The patient tolerated the procedure well. Post procedure instructions were given. A followup appointment was made.   The patient also brings up other issues during this injection visit Right hip pain continues has difficulty lifting up his leg when getting in and out of a car.  We discussed hip x-ray results showing moderate osteoarthritis right hip.  He has a history of corticosteroid allergy and therefore do not recommend a joint injection.  Would recommend orthopedic surgery referral.  Right shoulder pain with adhesive capsulitis.  He has had shoulder block with some partial relief but still has quite a bit of limitation with external rotation abduction and flexion.  Recommend evaluation by orthopedic surgery for manipulation under general anesthetic.

## 2021-03-14 DIAGNOSIS — M1611 Unilateral primary osteoarthritis, right hip: Secondary | ICD-10-CM | POA: Diagnosis not present

## 2021-03-14 DIAGNOSIS — E1159 Type 2 diabetes mellitus with other circulatory complications: Secondary | ICD-10-CM | POA: Diagnosis not present

## 2021-03-14 DIAGNOSIS — Z23 Encounter for immunization: Secondary | ICD-10-CM | POA: Diagnosis not present

## 2021-03-14 DIAGNOSIS — M7501 Adhesive capsulitis of right shoulder: Secondary | ICD-10-CM | POA: Diagnosis not present

## 2021-03-14 DIAGNOSIS — Z6839 Body mass index (BMI) 39.0-39.9, adult: Secondary | ICD-10-CM | POA: Diagnosis not present

## 2021-03-14 DIAGNOSIS — J309 Allergic rhinitis, unspecified: Secondary | ICD-10-CM | POA: Diagnosis not present

## 2021-03-14 DIAGNOSIS — E1169 Type 2 diabetes mellitus with other specified complication: Secondary | ICD-10-CM | POA: Diagnosis not present

## 2021-03-14 DIAGNOSIS — E785 Hyperlipidemia, unspecified: Secondary | ICD-10-CM | POA: Diagnosis not present

## 2021-03-14 DIAGNOSIS — E7800 Pure hypercholesterolemia, unspecified: Secondary | ICD-10-CM | POA: Diagnosis not present

## 2021-03-14 DIAGNOSIS — I152 Hypertension secondary to endocrine disorders: Secondary | ICD-10-CM | POA: Diagnosis not present

## 2021-03-16 DIAGNOSIS — M19011 Primary osteoarthritis, right shoulder: Secondary | ICD-10-CM | POA: Diagnosis not present

## 2021-04-05 DIAGNOSIS — I679 Cerebrovascular disease, unspecified: Secondary | ICD-10-CM | POA: Diagnosis not present

## 2021-04-05 DIAGNOSIS — M6281 Muscle weakness (generalized): Secondary | ICD-10-CM | POA: Diagnosis not present

## 2021-04-05 DIAGNOSIS — M25511 Pain in right shoulder: Secondary | ICD-10-CM | POA: Diagnosis not present

## 2021-04-08 DIAGNOSIS — M25511 Pain in right shoulder: Secondary | ICD-10-CM | POA: Diagnosis not present

## 2021-04-08 DIAGNOSIS — M6281 Muscle weakness (generalized): Secondary | ICD-10-CM | POA: Diagnosis not present

## 2021-04-08 DIAGNOSIS — I679 Cerebrovascular disease, unspecified: Secondary | ICD-10-CM | POA: Diagnosis not present

## 2021-04-11 DIAGNOSIS — M6281 Muscle weakness (generalized): Secondary | ICD-10-CM | POA: Diagnosis not present

## 2021-04-11 DIAGNOSIS — M25511 Pain in right shoulder: Secondary | ICD-10-CM | POA: Diagnosis not present

## 2021-04-11 DIAGNOSIS — I679 Cerebrovascular disease, unspecified: Secondary | ICD-10-CM | POA: Diagnosis not present

## 2021-04-13 DIAGNOSIS — I679 Cerebrovascular disease, unspecified: Secondary | ICD-10-CM | POA: Diagnosis not present

## 2021-04-13 DIAGNOSIS — M6281 Muscle weakness (generalized): Secondary | ICD-10-CM | POA: Diagnosis not present

## 2021-04-13 DIAGNOSIS — M25511 Pain in right shoulder: Secondary | ICD-10-CM | POA: Diagnosis not present

## 2021-04-19 DIAGNOSIS — M6281 Muscle weakness (generalized): Secondary | ICD-10-CM | POA: Diagnosis not present

## 2021-04-19 DIAGNOSIS — I679 Cerebrovascular disease, unspecified: Secondary | ICD-10-CM | POA: Diagnosis not present

## 2021-04-19 DIAGNOSIS — M25511 Pain in right shoulder: Secondary | ICD-10-CM | POA: Diagnosis not present

## 2021-04-20 DIAGNOSIS — Z01818 Encounter for other preprocedural examination: Secondary | ICD-10-CM | POA: Diagnosis not present

## 2021-04-20 DIAGNOSIS — Z6839 Body mass index (BMI) 39.0-39.9, adult: Secondary | ICD-10-CM | POA: Diagnosis not present

## 2021-04-20 DIAGNOSIS — I69359 Hemiplegia and hemiparesis following cerebral infarction affecting unspecified side: Secondary | ICD-10-CM | POA: Diagnosis not present

## 2021-04-20 DIAGNOSIS — M19011 Primary osteoarthritis, right shoulder: Secondary | ICD-10-CM | POA: Diagnosis not present

## 2021-04-21 DIAGNOSIS — I679 Cerebrovascular disease, unspecified: Secondary | ICD-10-CM | POA: Diagnosis not present

## 2021-04-21 DIAGNOSIS — M25511 Pain in right shoulder: Secondary | ICD-10-CM | POA: Diagnosis not present

## 2021-04-21 DIAGNOSIS — M6281 Muscle weakness (generalized): Secondary | ICD-10-CM | POA: Diagnosis not present

## 2021-04-21 DIAGNOSIS — Z01818 Encounter for other preprocedural examination: Secondary | ICD-10-CM | POA: Diagnosis not present

## 2021-04-25 DIAGNOSIS — M25511 Pain in right shoulder: Secondary | ICD-10-CM | POA: Diagnosis not present

## 2021-04-25 DIAGNOSIS — M6281 Muscle weakness (generalized): Secondary | ICD-10-CM | POA: Diagnosis not present

## 2021-04-25 DIAGNOSIS — I679 Cerebrovascular disease, unspecified: Secondary | ICD-10-CM | POA: Diagnosis not present

## 2021-04-27 DIAGNOSIS — I679 Cerebrovascular disease, unspecified: Secondary | ICD-10-CM | POA: Diagnosis not present

## 2021-04-27 DIAGNOSIS — M25511 Pain in right shoulder: Secondary | ICD-10-CM | POA: Diagnosis not present

## 2021-04-27 DIAGNOSIS — M6281 Muscle weakness (generalized): Secondary | ICD-10-CM | POA: Diagnosis not present

## 2021-05-04 DIAGNOSIS — M25511 Pain in right shoulder: Secondary | ICD-10-CM | POA: Diagnosis not present

## 2021-05-04 DIAGNOSIS — I679 Cerebrovascular disease, unspecified: Secondary | ICD-10-CM | POA: Diagnosis not present

## 2021-05-04 DIAGNOSIS — M6281 Muscle weakness (generalized): Secondary | ICD-10-CM | POA: Diagnosis not present

## 2021-05-11 DIAGNOSIS — M25511 Pain in right shoulder: Secondary | ICD-10-CM | POA: Diagnosis not present

## 2021-05-11 DIAGNOSIS — I679 Cerebrovascular disease, unspecified: Secondary | ICD-10-CM | POA: Diagnosis not present

## 2021-05-11 DIAGNOSIS — M6281 Muscle weakness (generalized): Secondary | ICD-10-CM | POA: Diagnosis not present

## 2021-05-25 DIAGNOSIS — M6281 Muscle weakness (generalized): Secondary | ICD-10-CM | POA: Diagnosis not present

## 2021-05-25 DIAGNOSIS — M25511 Pain in right shoulder: Secondary | ICD-10-CM | POA: Diagnosis not present

## 2021-05-25 DIAGNOSIS — I679 Cerebrovascular disease, unspecified: Secondary | ICD-10-CM | POA: Diagnosis not present

## 2021-05-30 DIAGNOSIS — M19011 Primary osteoarthritis, right shoulder: Secondary | ICD-10-CM | POA: Diagnosis not present

## 2021-06-09 ENCOUNTER — Ambulatory Visit: Payer: Medicare Other | Admitting: Physical Medicine & Rehabilitation

## 2021-06-13 DIAGNOSIS — M19011 Primary osteoarthritis, right shoulder: Secondary | ICD-10-CM | POA: Diagnosis not present

## 2021-06-13 DIAGNOSIS — M25511 Pain in right shoulder: Secondary | ICD-10-CM | POA: Diagnosis not present

## 2021-06-14 ENCOUNTER — Other Ambulatory Visit: Payer: Self-pay

## 2021-06-14 ENCOUNTER — Encounter: Payer: Medicare Other | Attending: Physical Medicine & Rehabilitation | Admitting: Physical Medicine & Rehabilitation

## 2021-06-14 ENCOUNTER — Encounter: Payer: Self-pay | Admitting: Physical Medicine & Rehabilitation

## 2021-06-14 VITALS — BP 143/91 | HR 78 | Temp 98.6°F | Resp 94 | Ht 65.0 in | Wt 237.0 lb

## 2021-06-14 DIAGNOSIS — G8320 Monoplegia of upper limb affecting unspecified side: Secondary | ICD-10-CM | POA: Diagnosis not present

## 2021-06-14 NOTE — Progress Notes (Signed)
Botox Injection for spasticity using needle EMG guidance  Dilution: 50 Units/ml Indication: Severe spasticity which interferes with ADL,mobility and/or  hygiene and is unresponsive to medication management and other conservative care Informed consent was obtained after describing risks and benefits of the procedure with the patient. This includes bleeding, bruising, infection, excessive weakness, or medication side effects. A REMS form is on file and signed. Needle: 27g 1" needle electrode Number of units per muscle FCR 25 U FDP 50U   FPL 25U  All injections were done after obtaining appropriate EMG activity and after negative drawback for blood. The patient tolerated the procedure well. Post procedure instructions were given. A followup appointment was made.   The patient also brings up other issues during this injection visit Right hip pain continues has difficulty lifting up his leg when getting in and out of a car.  We discussed hip x-ray results showing moderate osteoarthritis right hip.  He has a history of corticosteroid allergy and therefore do not recommend a joint injection.  Would recommend orthopedic surgery referral.  Right shoulder pain with adhesive capsulitis.  He has had shoulder block with some partial relief but still has quite a bit of limitation with external rotation abduction and flexion.  Recommend evaluation by orthopedic surgery for manipulation under general anesthetic.

## 2021-06-16 DIAGNOSIS — Z23 Encounter for immunization: Secondary | ICD-10-CM | POA: Diagnosis not present

## 2021-06-16 DIAGNOSIS — M19019 Primary osteoarthritis, unspecified shoulder: Secondary | ICD-10-CM | POA: Diagnosis not present

## 2021-06-22 DIAGNOSIS — E559 Vitamin D deficiency, unspecified: Secondary | ICD-10-CM | POA: Diagnosis not present

## 2021-06-22 DIAGNOSIS — Z01818 Encounter for other preprocedural examination: Secondary | ICD-10-CM | POA: Diagnosis not present

## 2021-06-22 DIAGNOSIS — M79609 Pain in unspecified limb: Secondary | ICD-10-CM | POA: Diagnosis not present

## 2021-07-13 DIAGNOSIS — G8918 Other acute postprocedural pain: Secondary | ICD-10-CM | POA: Diagnosis not present

## 2021-07-13 DIAGNOSIS — I10 Essential (primary) hypertension: Secondary | ICD-10-CM | POA: Diagnosis not present

## 2021-07-13 DIAGNOSIS — M19011 Primary osteoarthritis, right shoulder: Secondary | ICD-10-CM | POA: Diagnosis not present

## 2021-07-13 DIAGNOSIS — Z471 Aftercare following joint replacement surgery: Secondary | ICD-10-CM | POA: Diagnosis not present

## 2021-07-13 DIAGNOSIS — Z96611 Presence of right artificial shoulder joint: Secondary | ICD-10-CM | POA: Diagnosis not present

## 2021-07-13 DIAGNOSIS — E119 Type 2 diabetes mellitus without complications: Secondary | ICD-10-CM | POA: Diagnosis not present

## 2021-07-14 DIAGNOSIS — I10 Essential (primary) hypertension: Secondary | ICD-10-CM | POA: Diagnosis not present

## 2021-07-14 DIAGNOSIS — E119 Type 2 diabetes mellitus without complications: Secondary | ICD-10-CM | POA: Diagnosis not present

## 2021-07-14 DIAGNOSIS — M19011 Primary osteoarthritis, right shoulder: Secondary | ICD-10-CM | POA: Diagnosis not present

## 2021-07-15 DIAGNOSIS — I700 Atherosclerosis of aorta: Secondary | ICD-10-CM | POA: Diagnosis not present

## 2021-07-15 DIAGNOSIS — I10 Essential (primary) hypertension: Secondary | ICD-10-CM | POA: Diagnosis not present

## 2021-07-15 DIAGNOSIS — M19011 Primary osteoarthritis, right shoulder: Secondary | ICD-10-CM | POA: Diagnosis not present

## 2021-07-15 DIAGNOSIS — R0602 Shortness of breath: Secondary | ICD-10-CM | POA: Diagnosis not present

## 2021-07-15 DIAGNOSIS — R0902 Hypoxemia: Secondary | ICD-10-CM | POA: Diagnosis not present

## 2021-07-15 DIAGNOSIS — E119 Type 2 diabetes mellitus without complications: Secondary | ICD-10-CM | POA: Diagnosis not present

## 2021-07-15 DIAGNOSIS — K449 Diaphragmatic hernia without obstruction or gangrene: Secondary | ICD-10-CM | POA: Diagnosis not present

## 2021-07-16 DIAGNOSIS — R0902 Hypoxemia: Secondary | ICD-10-CM | POA: Diagnosis not present

## 2021-07-16 DIAGNOSIS — I10 Essential (primary) hypertension: Secondary | ICD-10-CM | POA: Diagnosis not present

## 2021-07-16 DIAGNOSIS — E119 Type 2 diabetes mellitus without complications: Secondary | ICD-10-CM | POA: Diagnosis not present

## 2021-07-16 DIAGNOSIS — M19011 Primary osteoarthritis, right shoulder: Secondary | ICD-10-CM | POA: Diagnosis not present

## 2021-07-20 DIAGNOSIS — M6281 Muscle weakness (generalized): Secondary | ICD-10-CM | POA: Diagnosis not present

## 2021-07-20 DIAGNOSIS — M25511 Pain in right shoulder: Secondary | ICD-10-CM | POA: Diagnosis not present

## 2021-07-22 DIAGNOSIS — M25511 Pain in right shoulder: Secondary | ICD-10-CM | POA: Diagnosis not present

## 2021-07-22 DIAGNOSIS — M6281 Muscle weakness (generalized): Secondary | ICD-10-CM | POA: Diagnosis not present

## 2021-07-24 HISTORY — PX: TOTAL SHOULDER REPLACEMENT: SUR1217

## 2021-07-25 DIAGNOSIS — M6281 Muscle weakness (generalized): Secondary | ICD-10-CM | POA: Diagnosis not present

## 2021-07-25 DIAGNOSIS — M25511 Pain in right shoulder: Secondary | ICD-10-CM | POA: Diagnosis not present

## 2021-07-27 DIAGNOSIS — M6281 Muscle weakness (generalized): Secondary | ICD-10-CM | POA: Diagnosis not present

## 2021-07-27 DIAGNOSIS — M25511 Pain in right shoulder: Secondary | ICD-10-CM | POA: Diagnosis not present

## 2021-08-02 DIAGNOSIS — M6281 Muscle weakness (generalized): Secondary | ICD-10-CM | POA: Diagnosis not present

## 2021-08-02 DIAGNOSIS — M25511 Pain in right shoulder: Secondary | ICD-10-CM | POA: Diagnosis not present

## 2021-08-04 DIAGNOSIS — M25511 Pain in right shoulder: Secondary | ICD-10-CM | POA: Diagnosis not present

## 2021-08-04 DIAGNOSIS — M6281 Muscle weakness (generalized): Secondary | ICD-10-CM | POA: Diagnosis not present

## 2021-08-09 DIAGNOSIS — U071 COVID-19: Secondary | ICD-10-CM | POA: Diagnosis not present

## 2021-08-16 DIAGNOSIS — M25511 Pain in right shoulder: Secondary | ICD-10-CM | POA: Diagnosis not present

## 2021-08-16 DIAGNOSIS — M6281 Muscle weakness (generalized): Secondary | ICD-10-CM | POA: Diagnosis not present

## 2021-08-18 DIAGNOSIS — M6281 Muscle weakness (generalized): Secondary | ICD-10-CM | POA: Diagnosis not present

## 2021-08-18 DIAGNOSIS — M25511 Pain in right shoulder: Secondary | ICD-10-CM | POA: Diagnosis not present

## 2021-08-23 DIAGNOSIS — M25511 Pain in right shoulder: Secondary | ICD-10-CM | POA: Diagnosis not present

## 2021-08-23 DIAGNOSIS — M6281 Muscle weakness (generalized): Secondary | ICD-10-CM | POA: Diagnosis not present

## 2021-08-25 DIAGNOSIS — M6281 Muscle weakness (generalized): Secondary | ICD-10-CM | POA: Diagnosis not present

## 2021-08-25 DIAGNOSIS — M25511 Pain in right shoulder: Secondary | ICD-10-CM | POA: Diagnosis not present

## 2021-08-26 DIAGNOSIS — M19011 Primary osteoarthritis, right shoulder: Secondary | ICD-10-CM | POA: Diagnosis not present

## 2021-08-26 DIAGNOSIS — Z96611 Presence of right artificial shoulder joint: Secondary | ICD-10-CM | POA: Diagnosis not present

## 2021-08-30 DIAGNOSIS — M25511 Pain in right shoulder: Secondary | ICD-10-CM | POA: Diagnosis not present

## 2021-08-30 DIAGNOSIS — M6281 Muscle weakness (generalized): Secondary | ICD-10-CM | POA: Diagnosis not present

## 2021-09-01 DIAGNOSIS — M25511 Pain in right shoulder: Secondary | ICD-10-CM | POA: Diagnosis not present

## 2021-09-01 DIAGNOSIS — M6281 Muscle weakness (generalized): Secondary | ICD-10-CM | POA: Diagnosis not present

## 2021-09-06 DIAGNOSIS — M25511 Pain in right shoulder: Secondary | ICD-10-CM | POA: Diagnosis not present

## 2021-09-06 DIAGNOSIS — M6281 Muscle weakness (generalized): Secondary | ICD-10-CM | POA: Diagnosis not present

## 2021-09-09 DIAGNOSIS — M25511 Pain in right shoulder: Secondary | ICD-10-CM | POA: Diagnosis not present

## 2021-09-09 DIAGNOSIS — M6281 Muscle weakness (generalized): Secondary | ICD-10-CM | POA: Diagnosis not present

## 2021-09-13 DIAGNOSIS — M6281 Muscle weakness (generalized): Secondary | ICD-10-CM | POA: Diagnosis not present

## 2021-09-13 DIAGNOSIS — M25511 Pain in right shoulder: Secondary | ICD-10-CM | POA: Diagnosis not present

## 2021-09-15 ENCOUNTER — Ambulatory Visit: Payer: Medicare Other | Admitting: Physical Medicine & Rehabilitation

## 2021-09-15 DIAGNOSIS — M25511 Pain in right shoulder: Secondary | ICD-10-CM | POA: Diagnosis not present

## 2021-09-15 DIAGNOSIS — M6281 Muscle weakness (generalized): Secondary | ICD-10-CM | POA: Diagnosis not present

## 2021-09-19 DIAGNOSIS — M6281 Muscle weakness (generalized): Secondary | ICD-10-CM | POA: Diagnosis not present

## 2021-09-19 DIAGNOSIS — M25511 Pain in right shoulder: Secondary | ICD-10-CM | POA: Diagnosis not present

## 2021-09-20 ENCOUNTER — Encounter: Payer: Medicare Other | Attending: Physical Medicine & Rehabilitation | Admitting: Physical Medicine & Rehabilitation

## 2021-09-20 ENCOUNTER — Encounter: Payer: Self-pay | Admitting: Physical Medicine & Rehabilitation

## 2021-09-20 ENCOUNTER — Other Ambulatory Visit: Payer: Self-pay

## 2021-09-20 VITALS — BP 156/87 | HR 67 | Temp 99.3°F | Ht 65.0 in | Wt 237.0 lb

## 2021-09-20 DIAGNOSIS — G8111 Spastic hemiplegia affecting right dominant side: Secondary | ICD-10-CM

## 2021-09-20 DIAGNOSIS — G8320 Monoplegia of upper limb affecting unspecified side: Secondary | ICD-10-CM | POA: Insufficient documentation

## 2021-09-20 NOTE — Progress Notes (Signed)
Botox Injection for spasticity using needle EMG guidance  Dilution: 50 Units/ml Indication: Severe spasticity which interferes with ADL,mobility and/or  hygiene and is unresponsive to medication management and other conservative care Informed consent was obtained after describing risks and benefits of the procedure with the patient. This includes bleeding, bruising, infection, excessive weakness, or medication side effects. A REMS form is on file and signed. Needle: 27g 1" needle electrode Number of units per muscle FCR 25 U FDP 25U FDS 25U targeted index finger with e stim  FPL 25U  All injections were done after obtaining appropriate EMG activity and after negative drawback for blood. The patient tolerated the procedure well. Post procedure instructions were given. A followup appointment was made.

## 2021-09-22 DIAGNOSIS — M25511 Pain in right shoulder: Secondary | ICD-10-CM | POA: Diagnosis not present

## 2021-09-22 DIAGNOSIS — M6281 Muscle weakness (generalized): Secondary | ICD-10-CM | POA: Diagnosis not present

## 2021-09-28 DIAGNOSIS — I69359 Hemiplegia and hemiparesis following cerebral infarction affecting unspecified side: Secondary | ICD-10-CM | POA: Diagnosis not present

## 2021-09-28 DIAGNOSIS — M6281 Muscle weakness (generalized): Secondary | ICD-10-CM | POA: Diagnosis not present

## 2021-09-28 DIAGNOSIS — Z139 Encounter for screening, unspecified: Secondary | ICD-10-CM | POA: Diagnosis not present

## 2021-09-28 DIAGNOSIS — M25511 Pain in right shoulder: Secondary | ICD-10-CM | POA: Diagnosis not present

## 2021-09-28 DIAGNOSIS — E785 Hyperlipidemia, unspecified: Secondary | ICD-10-CM | POA: Diagnosis not present

## 2021-09-28 DIAGNOSIS — Z125 Encounter for screening for malignant neoplasm of prostate: Secondary | ICD-10-CM | POA: Diagnosis not present

## 2021-09-28 DIAGNOSIS — E1159 Type 2 diabetes mellitus with other circulatory complications: Secondary | ICD-10-CM | POA: Diagnosis not present

## 2021-09-28 DIAGNOSIS — I152 Hypertension secondary to endocrine disorders: Secondary | ICD-10-CM | POA: Diagnosis not present

## 2021-09-28 DIAGNOSIS — Z6839 Body mass index (BMI) 39.0-39.9, adult: Secondary | ICD-10-CM | POA: Diagnosis not present

## 2021-09-28 DIAGNOSIS — E1169 Type 2 diabetes mellitus with other specified complication: Secondary | ICD-10-CM | POA: Diagnosis not present

## 2021-09-28 DIAGNOSIS — E7800 Pure hypercholesterolemia, unspecified: Secondary | ICD-10-CM | POA: Diagnosis not present

## 2021-10-04 DIAGNOSIS — M6281 Muscle weakness (generalized): Secondary | ICD-10-CM | POA: Diagnosis not present

## 2021-10-04 DIAGNOSIS — M25511 Pain in right shoulder: Secondary | ICD-10-CM | POA: Diagnosis not present

## 2021-10-06 DIAGNOSIS — M25511 Pain in right shoulder: Secondary | ICD-10-CM | POA: Diagnosis not present

## 2021-10-06 DIAGNOSIS — M6281 Muscle weakness (generalized): Secondary | ICD-10-CM | POA: Diagnosis not present

## 2021-10-07 DIAGNOSIS — M19011 Primary osteoarthritis, right shoulder: Secondary | ICD-10-CM | POA: Diagnosis not present

## 2021-10-07 DIAGNOSIS — Z96611 Presence of right artificial shoulder joint: Secondary | ICD-10-CM | POA: Diagnosis not present

## 2021-10-12 DIAGNOSIS — M25511 Pain in right shoulder: Secondary | ICD-10-CM | POA: Diagnosis not present

## 2021-10-12 DIAGNOSIS — M6281 Muscle weakness (generalized): Secondary | ICD-10-CM | POA: Diagnosis not present

## 2021-10-17 DIAGNOSIS — M6281 Muscle weakness (generalized): Secondary | ICD-10-CM | POA: Diagnosis not present

## 2021-10-17 DIAGNOSIS — M25511 Pain in right shoulder: Secondary | ICD-10-CM | POA: Diagnosis not present

## 2021-10-18 DIAGNOSIS — M1610 Unilateral primary osteoarthritis, unspecified hip: Secondary | ICD-10-CM | POA: Diagnosis not present

## 2021-10-18 DIAGNOSIS — M1711 Unilateral primary osteoarthritis, right knee: Secondary | ICD-10-CM | POA: Diagnosis not present

## 2021-10-18 DIAGNOSIS — M1611 Unilateral primary osteoarthritis, right hip: Secondary | ICD-10-CM | POA: Diagnosis not present

## 2021-10-26 DIAGNOSIS — M25511 Pain in right shoulder: Secondary | ICD-10-CM | POA: Diagnosis not present

## 2021-10-26 DIAGNOSIS — M6281 Muscle weakness (generalized): Secondary | ICD-10-CM | POA: Diagnosis not present

## 2021-11-03 ENCOUNTER — Encounter: Payer: Medicare Other | Attending: Physical Medicine & Rehabilitation | Admitting: Physical Medicine & Rehabilitation

## 2021-11-03 ENCOUNTER — Other Ambulatory Visit: Payer: Self-pay

## 2021-11-03 ENCOUNTER — Encounter: Payer: Self-pay | Admitting: Physical Medicine & Rehabilitation

## 2021-11-03 VITALS — BP 138/85 | HR 76 | Temp 98.1°F | Ht 65.0 in | Wt 235.0 lb

## 2021-11-03 DIAGNOSIS — G8320 Monoplegia of upper limb affecting unspecified side: Secondary | ICD-10-CM | POA: Diagnosis not present

## 2021-11-03 NOTE — Progress Notes (Signed)
? ?Subjective:  ? ? Patient ID: William Wolf, male    DOB: 1951/06/08, 71 y.o.   MRN: [id number] ? ?HPI ?71 year old male with history of left CVA causing right spastic hemiplegia mainly in the upper limb who returns today for spasticity evaluation. ?Follow-up after botulinum toxin injection 09/20/2021 the following muscles were injected ?FCR 25 U ?FDP 25U ?FDS 25U targeted index finger with e stim ? ?FPL 25U  ?The patient feels that this injection as opposed to the prior injection was more effective in reducing tone in the finger flexors especially of the index finger.  The prior injection did not inject the FDS but rather 50 units were injected into the FDP.  The remaining muscle groups were the same.  The patient had no post procedure complications. 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 ? ?The patient is having orthopedic issues and has been evaluated by orthopedics, states that he will be planning to undergo right hip replacement this summer and a right knee replacement at some point after that. ?Pain Inventory ?Average Pain 0 ?Pain Right Now 8 ?My pain is intermittent and aching ? ?In the last 24 hours, has pain interfered with the following? ?General activity 7 ?Relation with others 0 ?Enjoyment of life  a little ?What TIME of day is your pain at its worst? morning , daytime, evening, and night ?Sleep (in general) Fair ? ?Pain is worse with: walking, bending, sitting, inactivity, standing, and some activites ?Pain improves with: rest and medication ?Relief from Meds: 8 ? ?History reviewed. No pertinent family history. ?Social History  ? ?Socioeconomic History  ? Marital status: Married  ?  Spouse name: Not on file  ? Number of children: Not on file  ? Years of education: Not on file  ? Highest education level: Not on file  ?Occupational History  ? Not on file  ?Tobacco Use  ? Smoking status: Never  ? Smokeless tobacco: Never  ?Vaping Use  ? Vaping Use: Never used   ?Substance and Sexual Activity  ? Alcohol use: Not Currently  ? Drug use: Not Currently  ? Sexual activity: Not on file  ?Other Topics Concern  ? Not on file  ?Social History Narrative  ? Not on file  ? ?Social Determinants of Health  ? ?Financial Resource Strain: Not on file  ?Food Insecurity: Not on file  ?Transportation Needs: Not on file  ?Physical Activity: Not on file  ?Stress: Not on file  ?Social Connections: Not on file  ? ?Past Surgical History:  ?Procedure Laterality Date  ? right knee sugery    ? TOTAL SHOULDER REPLACEMENT Right 07/24/2021  ? ?Past Surgical History:  ?Procedure Laterality Date  ? right knee sugery    ? TOTAL SHOULDER REPLACEMENT Right 07/24/2021  ? ?Past Medical History:  ?Diagnosis Date  ? Hypertension   ? Stroke [facility])   ? ?BP (!) 151/88   Pulse 76   Temp 98.1 ?F (36.7 ?C)   Ht 5\' 5"  (1.651 m)   Wt 235 lb (106.6 kg)   SpO2 97%   BMI 39.11 kg/m?  ? ?Opioid Risk Score:   ?Fall Risk Score:  `1 ? ?Depression screen PHQ 2/9 ? ?Depression screen [facility] 2/9 11/03/2021 06/14/2021 10/08/2020  ?Decreased Interest 0 0 0  ?Down, Depressed, Hopeless 0 0 0  ?PHQ - 2 Score 0 0 0  ?Altered sleeping - - 0  ?Tired, decreased energy - - 1  ?Change in appetite - -  0  ?Feeling bad or failure about yourself  - - 0  ?Trouble concentrating - - 0  ?Moving slowly or fidgety/restless - - 0  ?Suicidal thoughts - - 0  ?PHQ-9 Score - - 1  ?Difficult doing work/chores - - Not difficult at all  ? ? ?Review of Systems  ?Musculoskeletal:  Positive for back pain.  ?     Right hip  ?Right knee  ?All other systems reviewed and are negative. ? ?   ?Objective:  ? Physical Exam ?Vitals and nursing note reviewed.  ?Constitutional:   ?   Appearance: He is obese.  ?HENT:  ?   Head: Normocephalic and atraumatic.  ?Eyes:  ?   Extraocular Movements: Extraocular movements intact.  ?   Conjunctiva/sclera: Conjunctivae normal.  ?   Pupils: Pupils are equal, round, and reactive to light.  ?Musculoskeletal:  ?   Comments: Antalgic gait  with decreased stance phase right lower extremity patient complains of hip and knee pain during ambulation.  ?Skin: ?   General: Skin is warm and dry.  ?Neurological:  ?   Mental Status: He is alert and oriented to person, place, and time.  ?   Comments: Tone right upper extremity ?MAS 1 in the wrist flexors ?MAS 1 in the thumb flexor ?MAS 2 in the finger flexors ?Motor strength is 4/5 in the right deltoid bicep tricep grip, 5/5 in left deltoid bicep tricep grip  ?Psychiatric:     ?   Mood and Affect: Mood normal.     ?   Behavior: Behavior normal.  ? ? ? ? ? ?   ?Assessment & Plan:  ?#1.  Right spastic monoplegia upper extremity, good results of spasticity reduction with botulinum toxin injections i.e. Botox. ?We will continue with current dosing, repeat in 6 weeks ?FCR 25 U ?FDP 25U ?FDS 25U targeted index finger with e stim ? ?FPL 25U  ?

## 2021-11-16 DIAGNOSIS — M6281 Muscle weakness (generalized): Secondary | ICD-10-CM | POA: Diagnosis not present

## 2021-11-16 DIAGNOSIS — M25511 Pain in right shoulder: Secondary | ICD-10-CM | POA: Diagnosis not present

## 2021-11-24 DIAGNOSIS — Z6839 Body mass index (BMI) 39.0-39.9, adult: Secondary | ICD-10-CM | POA: Diagnosis not present

## 2021-11-24 DIAGNOSIS — Z1331 Encounter for screening for depression: Secondary | ICD-10-CM | POA: Diagnosis not present

## 2021-11-24 DIAGNOSIS — E785 Hyperlipidemia, unspecified: Secondary | ICD-10-CM | POA: Diagnosis not present

## 2021-11-24 DIAGNOSIS — Z9181 History of falling: Secondary | ICD-10-CM | POA: Diagnosis not present

## 2021-11-24 DIAGNOSIS — E669 Obesity, unspecified: Secondary | ICD-10-CM | POA: Diagnosis not present

## 2021-11-24 DIAGNOSIS — Z0000 Encounter for general adult medical examination without abnormal findings: Secondary | ICD-10-CM | POA: Diagnosis not present

## 2021-12-19 DIAGNOSIS — M1611 Unilateral primary osteoarthritis, right hip: Secondary | ICD-10-CM | POA: Diagnosis not present

## 2021-12-20 ENCOUNTER — Ambulatory Visit: Payer: Medicare Other | Admitting: Physical Medicine & Rehabilitation

## 2021-12-20 ENCOUNTER — Encounter: Payer: Medicare Other | Attending: Physical Medicine & Rehabilitation | Admitting: Physical Medicine & Rehabilitation

## 2021-12-20 ENCOUNTER — Encounter: Payer: Self-pay | Admitting: Physical Medicine & Rehabilitation

## 2021-12-20 VITALS — BP 145/82 | HR 75 | Ht 65.0 in | Wt 235.0 lb

## 2021-12-20 DIAGNOSIS — R252 Cramp and spasm: Secondary | ICD-10-CM | POA: Insufficient documentation

## 2021-12-20 DIAGNOSIS — G8320 Monoplegia of upper limb affecting unspecified side: Secondary | ICD-10-CM

## 2021-12-20 NOTE — Progress Notes (Signed)
Botox Injection for spasticity using needle EMG guidance ? ?Dilution: 50 Units/ml ?Indication: Severe spasticity which interferes with ADL,mobility and/or  hygiene and is unresponsive to medication management and other conservative care ?Informed consent was obtained after describing risks and benefits of the procedure with the patient. This includes bleeding, bruising, infection, excessive weakness, or medication side effects. A REMS form is on file and signed. ?Needle: 27g 1" needle electrode ?Number of units per muscle ?FCR 25 U ?FDP 25U ?FDS 25U targeted index finger with e stim ? ?FPL 25U  ?All injections were done after obtaining appropriate EMG activity and after negative drawback for blood. The patient tolerated the procedure well. Post procedure instructions were given. A followup appointment was made.  ?

## 2021-12-20 NOTE — Patient Instructions (Signed)

## 2022-01-28 DIAGNOSIS — H52223 Regular astigmatism, bilateral: Secondary | ICD-10-CM | POA: Diagnosis not present

## 2022-01-28 DIAGNOSIS — H5203 Hypermetropia, bilateral: Secondary | ICD-10-CM | POA: Diagnosis not present

## 2022-01-28 DIAGNOSIS — H2513 Age-related nuclear cataract, bilateral: Secondary | ICD-10-CM | POA: Diagnosis not present

## 2022-03-21 ENCOUNTER — Encounter: Payer: Self-pay | Admitting: Physical Medicine & Rehabilitation

## 2022-03-21 ENCOUNTER — Encounter: Payer: Medicare Other | Attending: Physical Medicine & Rehabilitation | Admitting: Physical Medicine & Rehabilitation

## 2022-03-21 VITALS — BP 138/84 | HR 77 | Temp 98.1°F | Ht 65.0 in

## 2022-03-21 DIAGNOSIS — G8320 Monoplegia of upper limb affecting unspecified side: Secondary | ICD-10-CM | POA: Insufficient documentation

## 2022-03-21 DIAGNOSIS — I69831 Monoplegia of upper limb following other cerebrovascular disease affecting right dominant side: Secondary | ICD-10-CM | POA: Insufficient documentation

## 2022-03-21 MED ORDER — ONABOTULINUMTOXINA 100 UNITS IJ SOLR: 100.0000 [IU] | Freq: Once | INTRAMUSCULAR | Status: AC

## 2022-03-21 NOTE — Progress Notes (Signed)
Botox Injection for spasticity using needle EMG guidance  Dilution: 50 Units/ml Indication: Severe spasticity which interferes with ADL,mobility and/or  hygiene and is unresponsive to medication management and other conservative care Informed consent was obtained after describing risks and benefits of the procedure with the patient. This includes bleeding, bruising, infection, excessive weakness, or medication side effects. A REMS form is on file and signed. Needle: 27g 1" needle electrode Number of units per muscle FCR 25 U FDP 25U FDS 25U targeted index finger with e stim  FPL 25U  All injections were done after obtaining appropriate EMG activity and after negative drawback for blood. The patient tolerated the procedure well. Post procedure instructions were given. A followup appointment was made.   Patient having surgery in September, unsure as to his ability to drive here in October.  Will not schedule for repeat Botox until the patient has his surgery done and have a better idea on the duration of postoperative recovery

## 2022-03-21 NOTE — Patient Instructions (Signed)
Please call to schedule another Botox injection  We have been using 100U , we can increase to 200U if needed

## 2022-03-29 DIAGNOSIS — E7800 Pure hypercholesterolemia, unspecified: Secondary | ICD-10-CM | POA: Diagnosis not present

## 2022-03-29 DIAGNOSIS — E785 Hyperlipidemia, unspecified: Secondary | ICD-10-CM | POA: Diagnosis not present

## 2022-03-29 DIAGNOSIS — Z23 Encounter for immunization: Secondary | ICD-10-CM | POA: Diagnosis not present

## 2022-03-29 DIAGNOSIS — M1611 Unilateral primary osteoarthritis, right hip: Secondary | ICD-10-CM | POA: Diagnosis not present

## 2022-03-29 DIAGNOSIS — I152 Hypertension secondary to endocrine disorders: Secondary | ICD-10-CM | POA: Diagnosis not present

## 2022-03-29 DIAGNOSIS — E1159 Type 2 diabetes mellitus with other circulatory complications: Secondary | ICD-10-CM | POA: Diagnosis not present

## 2022-03-29 DIAGNOSIS — E1169 Type 2 diabetes mellitus with other specified complication: Secondary | ICD-10-CM | POA: Diagnosis not present

## 2022-03-29 DIAGNOSIS — I69359 Hemiplegia and hemiparesis following cerebral infarction affecting unspecified side: Secondary | ICD-10-CM | POA: Diagnosis not present

## 2022-04-25 ENCOUNTER — Other Ambulatory Visit: Payer: Self-pay

## 2022-04-25 NOTE — Patient Outreach (Signed)
  Care Coordination   Initial Visit Note   04/25/2022 Name: William Wolf MRN: [id number] DOB: 1951-04-24  William Wolf is a 71 y.o. year old male who sees [name], Durward Fortes, MD for primary care. I spoke with  William Wolf by phone today  What matters to the patients health and wellness today?  Placed call to patient today and explained [facility] care coordination program.  Patient has agreed to services. Patient reports his concern today is his hip pain. Reports he is scheduled for a hip replacement on 05/29/2022.  Reports pain medications make him sick.      Goals Addressed               This Visit's Progress     Patient would like to have decreased pain in his hip. (pt-stated)        Care Coordination Interventions: Reviewed provider established plan for pain management Reviewed with patient prescribed pharmacological and nonpharmacological pain relief strategies Encouraged patient to notify MD of his pain and not being able to take oxycodone.          SDOH assessments and interventions completed:  No     Care Coordination Interventions Activated:  Yes  Care Coordination Interventions:  Yes, provided   Follow up plan: Follow up call scheduled for 05/17/2022    Encounter Outcome:  Pt. Visit Completed   Rowe Pavy, RN, BSN, CEN [facility] Coordinator [phone]

## 2022-05-17 ENCOUNTER — Ambulatory Visit: Payer: Self-pay

## 2022-05-17 NOTE — Patient Outreach (Signed)
  Care Coordination   Follow Up Visit Note   05/17/2022 Name: William Wolf MRN: [id number] DOB: 1950-10-09  William Wolf is a 71 y.o. year old male who sees [name], Durward Fortes, MD for primary care. I spoke with  William Wolf by phone today.  What matters to the patients health and wellness today?  I continue to have hip pain. I am having surgery on 9/25    Goals Addressed               This Visit's Progress     Patient would like to have decreased pain in his hip. (pt-stated)        Care Coordination Interventions: Reviewed provider established plan for pain management Reviewed with patient prescribed pharmacological and nonpharmacological pain relief strategies Assessed pain Reviewed plan for surgery Encouraged patient to inform MD of what medications that he is taking for pain-- Diclofenac 75mg .  Reviewed with patient that he has support at home and transportation.         SDOH assessments and interventions completed:  No     Care Coordination Interventions Activated:  Yes  Care Coordination Interventions:  Yes, provided   Follow up plan: Follow up call scheduled for 06/01/2022    Encounter Outcome:  Pt. Visit Completed   06/03/2022, RN, BSN, CEN [facility] Coordinator [phone]

## 2022-05-18 DIAGNOSIS — R52 Pain, unspecified: Secondary | ICD-10-CM | POA: Diagnosis not present

## 2022-05-18 DIAGNOSIS — Z79899 Other long term (current) drug therapy: Secondary | ICD-10-CM | POA: Diagnosis not present

## 2022-05-18 DIAGNOSIS — M79609 Pain in unspecified limb: Secondary | ICD-10-CM | POA: Diagnosis not present

## 2022-05-29 DIAGNOSIS — I69359 Hemiplegia and hemiparesis following cerebral infarction affecting unspecified side: Secondary | ICD-10-CM | POA: Diagnosis not present

## 2022-05-29 DIAGNOSIS — Z96641 Presence of right artificial hip joint: Secondary | ICD-10-CM | POA: Diagnosis not present

## 2022-05-29 DIAGNOSIS — Z23 Encounter for immunization: Secondary | ICD-10-CM | POA: Diagnosis not present

## 2022-05-29 DIAGNOSIS — Z6841 Body Mass Index (BMI) 40.0 and over, adult: Secondary | ICD-10-CM | POA: Diagnosis not present

## 2022-05-29 DIAGNOSIS — M1612 Unilateral primary osteoarthritis, left hip: Secondary | ICD-10-CM | POA: Diagnosis not present

## 2022-05-29 DIAGNOSIS — E119 Type 2 diabetes mellitus without complications: Secondary | ICD-10-CM | POA: Diagnosis not present

## 2022-05-29 DIAGNOSIS — Z794 Long term (current) use of insulin: Secondary | ICD-10-CM | POA: Diagnosis not present

## 2022-05-29 DIAGNOSIS — M1611 Unilateral primary osteoarthritis, right hip: Secondary | ICD-10-CM | POA: Diagnosis not present

## 2022-05-29 DIAGNOSIS — Z471 Aftercare following joint replacement surgery: Secondary | ICD-10-CM | POA: Diagnosis not present

## 2022-05-29 DIAGNOSIS — R2689 Other abnormalities of gait and mobility: Secondary | ICD-10-CM | POA: Diagnosis not present

## 2022-05-29 DIAGNOSIS — I10 Essential (primary) hypertension: Secondary | ICD-10-CM | POA: Diagnosis not present

## 2022-05-30 DIAGNOSIS — E119 Type 2 diabetes mellitus without complications: Secondary | ICD-10-CM | POA: Diagnosis not present

## 2022-05-30 DIAGNOSIS — R2689 Other abnormalities of gait and mobility: Secondary | ICD-10-CM | POA: Diagnosis not present

## 2022-05-30 DIAGNOSIS — I69359 Hemiplegia and hemiparesis following cerebral infarction affecting unspecified side: Secondary | ICD-10-CM | POA: Diagnosis not present

## 2022-05-30 DIAGNOSIS — I10 Essential (primary) hypertension: Secondary | ICD-10-CM | POA: Diagnosis not present

## 2022-05-30 DIAGNOSIS — M1611 Unilateral primary osteoarthritis, right hip: Secondary | ICD-10-CM | POA: Diagnosis not present

## 2022-06-01 ENCOUNTER — Ambulatory Visit: Payer: Self-pay

## 2022-06-01 NOTE — Patient Outreach (Signed)
  Care Coordination   Follow Up Visit Note   06/01/2022 Name: Phinehas Grounds MRN: [id number] DOB: 06-Oct-1950  Shykeem Resurreccion is a 71 y.o. year old male who sees [name], Lorin Mercy, MD for primary care. I spoke with  [name] by phone today.  What matters to the patients health and wellness today?  Patient reports that his surgery for his hip replacement was a success. Reports he is walking with a walker and taking pain medications as needed.  Reports no additional concerns today. Has post op follow up planned.    Goals Addressed               This Visit's Progress     Patient would like to have decreased pain in his hip. (pt-stated)        Care Coordination Interventions: Reviewed his surgery and the success. Assessed pain- Pain 2/10 today. Reports that he is doing his home exercises. Confirmed follow up appointment.          SDOH assessments and interventions completed:  No     Care Coordination Interventions Activated:  Yes  Care Coordination Interventions:  Yes, provided   Follow up plan: Follow up call scheduled for 06/29/2022    Encounter Outcome:  Pt. Visit Completed   Tomasa Rand, RN, BSN, CEN [place] Coordinator [phone]

## 2022-06-13 DIAGNOSIS — M1611 Unilateral primary osteoarthritis, right hip: Secondary | ICD-10-CM | POA: Diagnosis not present

## 2022-06-28 DIAGNOSIS — T8189XA Other complications of procedures, not elsewhere classified, initial encounter: Secondary | ICD-10-CM | POA: Diagnosis not present

## 2022-06-29 ENCOUNTER — Ambulatory Visit: Payer: Self-pay

## 2022-06-29 NOTE — Patient Outreach (Signed)
  Care Coordination   Follow Up Visit Note   06/29/2022 Name: William Wolf MRN: [id number] DOB: 06-02-1951  William Wolf is a 71 y.o. year old male who sees [name], Lorin Mercy, MD for primary care. I spoke with  William Wolf by phone today.  What matters to the patients health and wellness today?  Placed call to patient for follow up from hip replacement. Patient reports that he is doing well with the exception that his incision got infected. Reports that he is taking Cephalexin 500 mg 4 times per day. States that he has 2 days left. Reports no pain. Reports he is able to do his exercises. Denies any other concerns. Reports that he is self managing well.    Goals Addressed               This Visit's Progress     COMPLETED: Patient would like to have decreased pain in his hip. (pt-stated)        Care Coordination Interventions: Pain assessment completed. Today 0/10 Denies any other needs. -case closed          SDOH assessments and interventions completed:  No     Care Coordination Interventions Activated:  Yes  Care Coordination Interventions:  Yes, provided   Follow up plan: No further intervention required.   Encounter Outcome:  Pt. Visit Completed   William Rand, RN, BSN, CEN [place] Coordinator [phone]

## 2022-07-04 DIAGNOSIS — Z96641 Presence of right artificial hip joint: Secondary | ICD-10-CM | POA: Diagnosis not present

## 2022-07-04 DIAGNOSIS — M1611 Unilateral primary osteoarthritis, right hip: Secondary | ICD-10-CM | POA: Diagnosis not present

## 2022-07-04 DIAGNOSIS — M1610 Unilateral primary osteoarthritis, unspecified hip: Secondary | ICD-10-CM | POA: Diagnosis not present

## 2022-07-04 DIAGNOSIS — M1711 Unilateral primary osteoarthritis, right knee: Secondary | ICD-10-CM | POA: Diagnosis not present

## 2022-07-05 DIAGNOSIS — T8189XA Other complications of procedures, not elsewhere classified, initial encounter: Secondary | ICD-10-CM | POA: Diagnosis not present

## 2022-07-19 DIAGNOSIS — T8189XA Other complications of procedures, not elsewhere classified, initial encounter: Secondary | ICD-10-CM | POA: Diagnosis not present

## 2022-07-20 DIAGNOSIS — M1712 Unilateral primary osteoarthritis, left knee: Secondary | ICD-10-CM | POA: Diagnosis not present

## 2022-08-08 ENCOUNTER — Encounter: Payer: Medicare Other | Attending: Physical Medicine & Rehabilitation | Admitting: Physical Medicine & Rehabilitation

## 2022-08-08 ENCOUNTER — Encounter: Payer: Self-pay | Admitting: Physical Medicine & Rehabilitation

## 2022-08-08 VITALS — BP 171/93 | HR 72 | Ht 65.0 in | Wt 234.4 lb

## 2022-08-08 DIAGNOSIS — I69831 Monoplegia of upper limb following other cerebrovascular disease affecting right dominant side: Secondary | ICD-10-CM | POA: Diagnosis not present

## 2022-08-08 DIAGNOSIS — G8320 Monoplegia of upper limb affecting unspecified side: Secondary | ICD-10-CM | POA: Diagnosis not present

## 2022-08-08 MED ORDER — ONABOTULINUMTOXINA 100 UNITS IJ SOLR
200.0000 [IU] | Freq: Once | INTRAMUSCULAR | Status: AC
  Administered 2022-08-08: 200 [IU] via INTRAMUSCULAR

## 2022-08-08 NOTE — Progress Notes (Signed)
Botox Injection for spasticity using needle EMG guidance  Dilution: 50 Units/ml Indication: Severe spasticity which interferes with ADL,mobility and/or  hygiene and is unresponsive to medication management and other conservative care Informed consent was obtained after describing risks and benefits of the procedure with the patient. This includes bleeding, bruising, infection, excessive weakness, or medication side effects. A REMS form is on file and signed. Needle: 27g 1" needle electrode Number of units per muscle FCR 50 U FDP 50U FDS 50U targeted index finger with e stim  FPL 50U  All injections were done after obtaining appropriate EMG activity and after negative drawback for blood. The patient tolerated the procedure well. Post procedure instructions were given. A followup appointment was made.

## 2022-08-08 NOTE — Patient Instructions (Signed)

## 2022-08-09 DIAGNOSIS — T8189XA Other complications of procedures, not elsewhere classified, initial encounter: Secondary | ICD-10-CM | POA: Diagnosis not present

## 2022-08-10 DIAGNOSIS — M1712 Unilateral primary osteoarthritis, left knee: Secondary | ICD-10-CM | POA: Diagnosis not present

## 2022-08-15 DIAGNOSIS — M1712 Unilateral primary osteoarthritis, left knee: Secondary | ICD-10-CM | POA: Diagnosis not present

## 2022-08-24 DIAGNOSIS — M7752 Other enthesopathy of left foot: Secondary | ICD-10-CM | POA: Diagnosis not present

## 2022-08-25 DIAGNOSIS — M1712 Unilateral primary osteoarthritis, left knee: Secondary | ICD-10-CM | POA: Diagnosis not present

## 2022-08-31 ENCOUNTER — Encounter: Payer: Medicare Other | Admitting: Physical Medicine & Rehabilitation

## 2022-09-19 ENCOUNTER — Encounter: Payer: Self-pay | Admitting: Physical Medicine & Rehabilitation

## 2022-09-19 ENCOUNTER — Encounter: Payer: Medicare Other | Attending: Physical Medicine & Rehabilitation | Admitting: Physical Medicine & Rehabilitation

## 2022-09-19 VITALS — BP 161/90 | HR 68 | Ht 65.0 in

## 2022-09-19 DIAGNOSIS — G8320 Monoplegia of upper limb affecting unspecified side: Secondary | ICD-10-CM | POA: Diagnosis not present

## 2022-09-19 NOTE — Patient Instructions (Signed)
Will reduce Botox dose next visit

## 2022-09-19 NOTE — Progress Notes (Signed)
Subjective:    Patient ID: William Wolf, male    DOB: July 09, 1951, 72 y.o.   MRN: [id number]  HPI 72 year old male with remote left CVA causing right spastic monoplegia.  Spasticity has not been relieved with oralmedication management or therapy.  He has had good results with botulinum toxin injection.  Compared to prior injections increased dose in December 2023 due to complaints that the medication wore off too quickly  08/08/22 FCR 50 U FDP 50U FDS 50U targeted index finger with e stim   FPL 50U   03/21/22 FCR 25 U FDP 25U FDS 25U targeted index finger with e stim   FPL 25U Pain Inventory Average Pain 2 Pain Right Now 1 My pain is burning, dull, and tingling  In the last 24 hours, has pain interfered with the following? General activity 7 Relation with others 1 Enjoyment of life 5 What TIME of day is your pain at its worst? night Sleep (in general) Poor  Pain is worse with: unsure Pain improves with: medication Relief from Meds: 4  History reviewed. No pertinent family history. Social History   Socioeconomic History   Marital status: Married    Spouse name: Not on file   Number of children: Not on file   Years of education: Not on file   Highest education level: Not on file  Occupational History   Not on file  Tobacco Use   Smoking status: Never   Smokeless tobacco: Never  Vaping Use   Vaping Use: Never used  Substance and Sexual Activity   Alcohol use: Not Currently   Drug use: Not Currently   Sexual activity: Not on file  Other Topics Concern   Not on file  Social History Narrative   Not on file   Social Determinants of Health   Financial Resource Strain: Not on file  Food Insecurity: Not on file  Transportation Needs: Not on file  Physical Activity: Not on file  Stress: Not on file  Social Connections: Not on file   Past Surgical History:  Procedure Laterality Date   right knee sugery     TOTAL SHOULDER REPLACEMENT Right 07/24/2021    Past Surgical History:  Procedure Laterality Date   right knee sugery     TOTAL SHOULDER REPLACEMENT Right 07/24/2021   Past Medical History:  Diagnosis Date   Hypertension    Stroke ([place])    Ht 5\' 5"  (1.651 m)   BMI 39.01 kg/m   Opioid Risk Score:   Fall Risk Score:  `1  Depression screen [facility] 2/9     09/19/2022   11:11 AM 08/08/2022    9:37 AM 03/21/2022    1:05 PM 11/03/2021    1:52 PM 06/14/2021    1:08 PM 10/08/2020    1:14 PM  Depression screen PHQ 2/9  Decreased Interest 0 0 0 0 0 0  Down, Depressed, Hopeless 0 0 0 0 0 0  PHQ - 2 Score 0 0 0 0 0 0  Altered sleeping      0  Tired, decreased energy      1  Change in appetite      0  Feeling bad or failure about yourself       0  Trouble concentrating      0  Moving slowly or fidgety/restless      0  Suicidal thoughts      0  PHQ-9 Score      1  Difficult doing work/chores  Not difficult at all     Review of Systems  Musculoskeletal:        Right Arm Pain   All other systems reviewed and are negative.     Objective:   Physical Exam Tone: MAS 0 at the right finger flexors and wrist flexors and thumb flexor. Motor strength is 3 - at the finger flexors wrist flexors 4 at the bicep and deltoid on the right side. Ambulates without assistive device no evidence of toe drag or knee instability General no acute distress mood and affect appropriate        Assessment & Plan:  #1.  Right spastic monoplegia after left CVA.  He gets good relief of his spasticity with botulinum toxin the dose was increased last time due to complaints that it may have worn off a little bit too quickly.  Unfortunately the patient's had excess weakness in his finger flexors of the right hand with the higher dose botulinum toxin.  Will reduce dose to 100 units next visit in 6 weeks. Expect current weakness to wear off in 4 to 6 weeks  Inject FDP 25 units FDS 25 units FPL 25 units FCR 25 units

## 2022-09-22 DIAGNOSIS — Z96641 Presence of right artificial hip joint: Secondary | ICD-10-CM | POA: Diagnosis not present

## 2022-09-22 DIAGNOSIS — M1611 Unilateral primary osteoarthritis, right hip: Secondary | ICD-10-CM | POA: Diagnosis not present

## 2022-10-06 DIAGNOSIS — M1712 Unilateral primary osteoarthritis, left knee: Secondary | ICD-10-CM | POA: Diagnosis not present

## 2022-10-06 DIAGNOSIS — M1711 Unilateral primary osteoarthritis, right knee: Secondary | ICD-10-CM | POA: Diagnosis not present

## 2022-10-18 DIAGNOSIS — I69359 Hemiplegia and hemiparesis following cerebral infarction affecting unspecified side: Secondary | ICD-10-CM | POA: Diagnosis not present

## 2022-10-18 DIAGNOSIS — E785 Hyperlipidemia, unspecified: Secondary | ICD-10-CM | POA: Diagnosis not present

## 2022-10-18 DIAGNOSIS — Z139 Encounter for screening, unspecified: Secondary | ICD-10-CM | POA: Diagnosis not present

## 2022-10-18 DIAGNOSIS — Z125 Encounter for screening for malignant neoplasm of prostate: Secondary | ICD-10-CM | POA: Diagnosis not present

## 2022-10-18 DIAGNOSIS — N3941 Urge incontinence: Secondary | ICD-10-CM | POA: Diagnosis not present

## 2022-10-18 DIAGNOSIS — M170 Bilateral primary osteoarthritis of knee: Secondary | ICD-10-CM | POA: Diagnosis not present

## 2022-10-18 DIAGNOSIS — E1159 Type 2 diabetes mellitus with other circulatory complications: Secondary | ICD-10-CM | POA: Diagnosis not present

## 2022-10-18 DIAGNOSIS — E1169 Type 2 diabetes mellitus with other specified complication: Secondary | ICD-10-CM | POA: Diagnosis not present

## 2022-10-18 DIAGNOSIS — E7800 Pure hypercholesterolemia, unspecified: Secondary | ICD-10-CM | POA: Diagnosis not present

## 2022-10-18 DIAGNOSIS — B029 Zoster without complications: Secondary | ICD-10-CM | POA: Diagnosis not present

## 2022-10-18 DIAGNOSIS — I152 Hypertension secondary to endocrine disorders: Secondary | ICD-10-CM | POA: Diagnosis not present

## 2022-10-26 IMAGING — CR DG HIP (WITH OR WITHOUT PELVIS) 2-3V*R*
2 series · 2 of 2 positions shown · non-contrast
Comparison: None.

CLINICAL DATA: Chronic right hip pain.

EXAM:
DG HIP (WITH OR WITHOUT PELVIS) 2-3V RIGHT

[t pelvis ap]
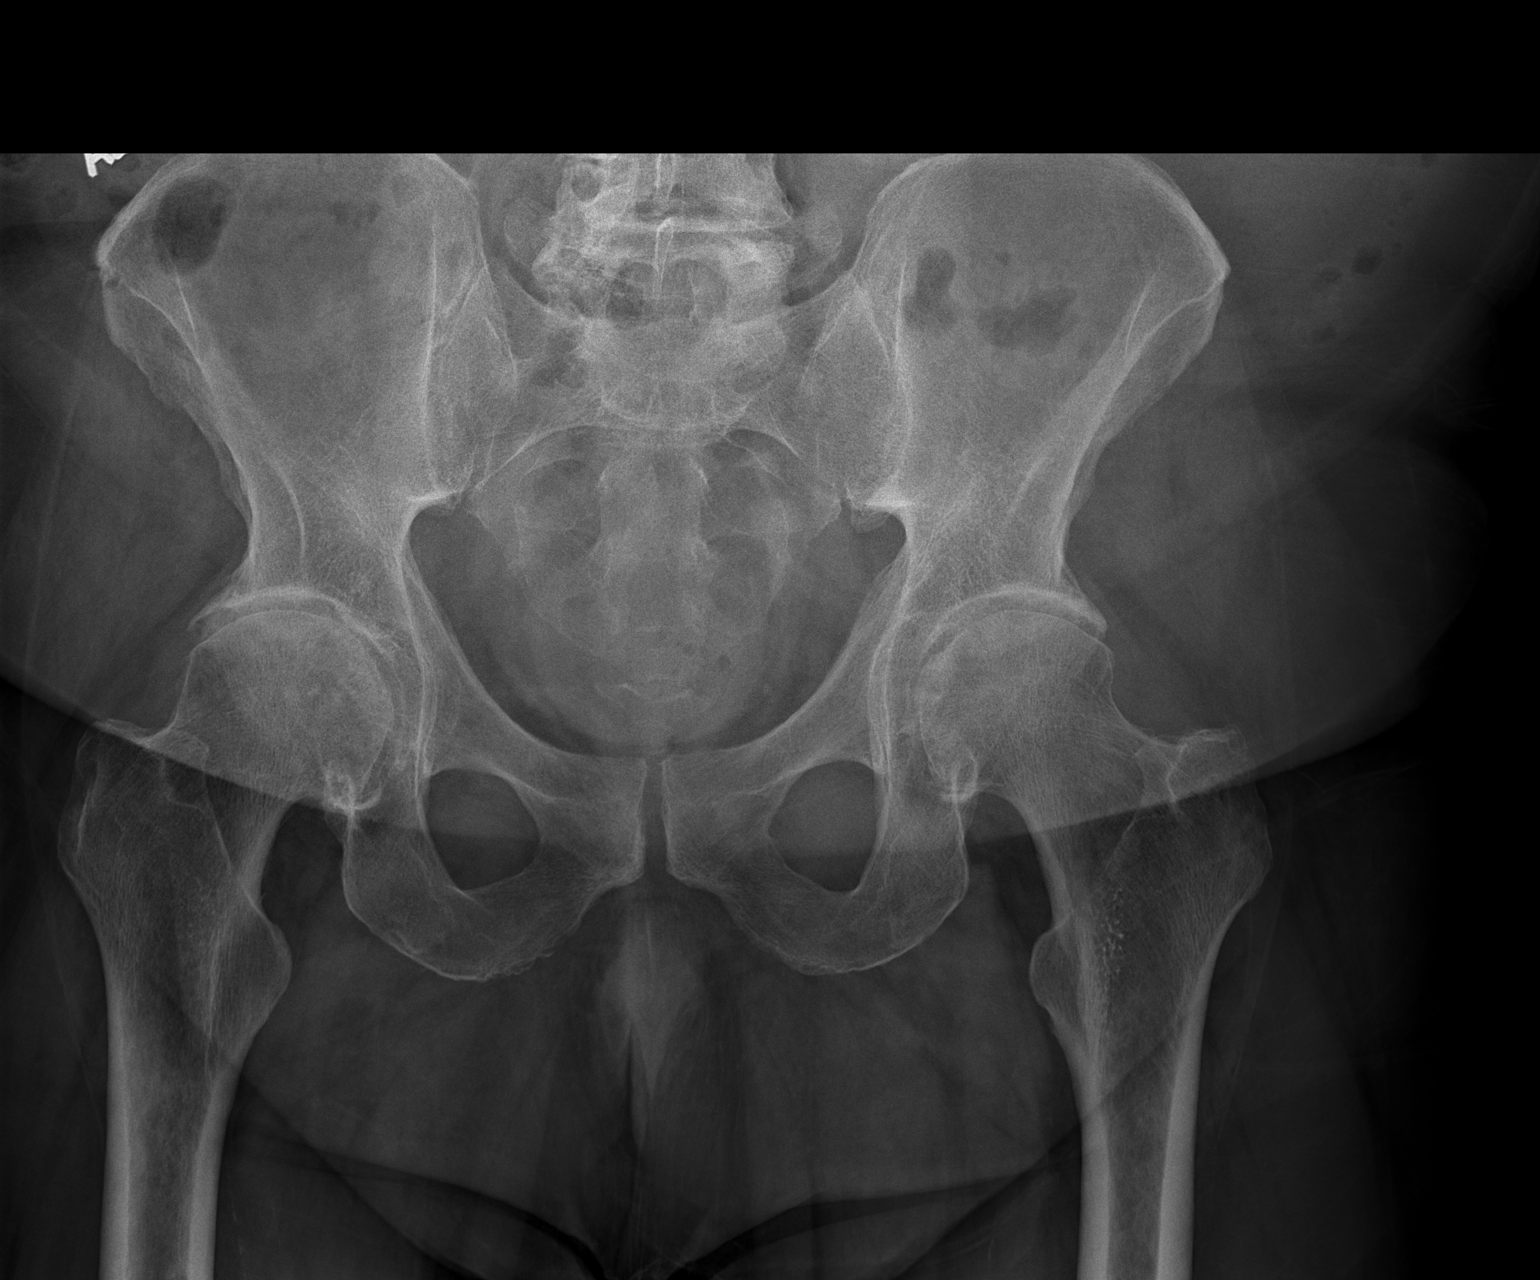

[t hip frog right]
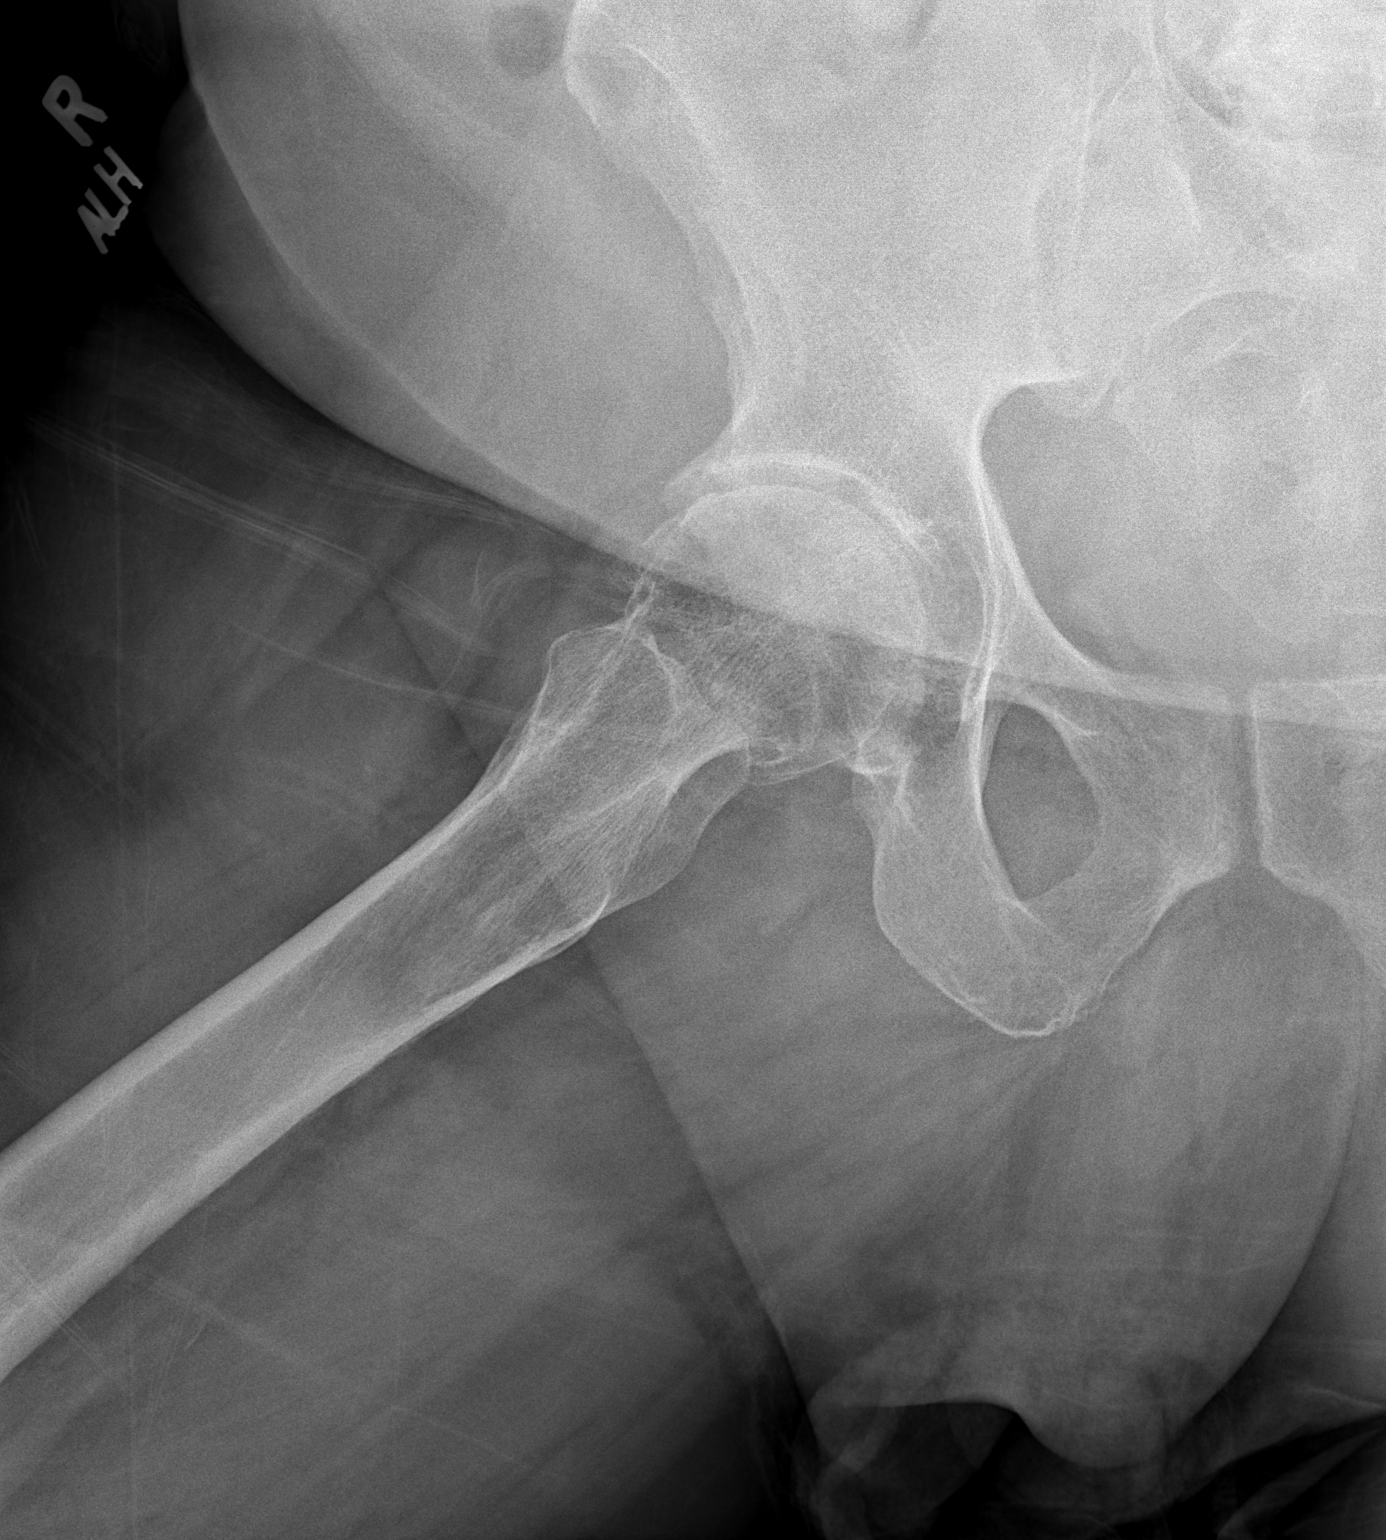

[2 of 2 positions shown; findings below may reference images not displayed]

FINDINGS: There is no evidence of hip fracture or dislocation. Moderate
osteophyte formation is seen involving the right hip joint.
IMPRESSION: Moderate degenerative joint disease of the right hip. No acute
abnormality is noted.

## 2022-11-09 ENCOUNTER — Encounter: Payer: Medicare Other | Attending: Physical Medicine & Rehabilitation | Admitting: Physical Medicine & Rehabilitation

## 2022-11-09 ENCOUNTER — Encounter: Payer: Self-pay | Admitting: Physical Medicine & Rehabilitation

## 2022-11-09 VITALS — BP 163/92 | HR 61 | Ht 65.0 in | Wt 234.0 lb

## 2022-11-09 DIAGNOSIS — G8320 Monoplegia of upper limb affecting unspecified side: Secondary | ICD-10-CM | POA: Diagnosis not present

## 2022-11-09 DIAGNOSIS — M25562 Pain in left knee: Secondary | ICD-10-CM | POA: Insufficient documentation

## 2022-11-09 DIAGNOSIS — I69331 Monoplegia of upper limb following cerebral infarction affecting right dominant side: Secondary | ICD-10-CM | POA: Insufficient documentation

## 2022-11-09 DIAGNOSIS — G8929 Other chronic pain: Secondary | ICD-10-CM | POA: Insufficient documentation

## 2022-11-09 DIAGNOSIS — G8321 Monoplegia of upper limb affecting right dominant side: Secondary | ICD-10-CM

## 2022-11-09 MED ORDER — ONABOTULINUMTOXINA 100 UNITS IJ SOLR
100.0000 [IU] | Freq: Once | INTRAMUSCULAR | Status: AC
  Administered 2022-11-09: 100 [IU] via INTRAMUSCULAR

## 2022-11-09 NOTE — Progress Notes (Signed)
Botox Injection for spasticity using needle EMG guidance  Dilution: 50 Units/ml Indication: Severe spasticity which interferes with ADL,mobility and/or  hygiene and is unresponsive to medication management and other conservative care Informed consent was obtained after describing risks and benefits of the procedure with the patient. This includes bleeding, bruising, infection, excessive weakness, or medication side effects. A REMS form is on file and signed. Needle: 27g 1" needle electrode Number of units per muscle FCR 25 U FDP 25U FDS 25U targeted index finger with e stim  FPL 25U  All injections were done after obtaining appropriate EMG activity and after negative drawback for blood. The patient tolerated the procedure well. Post procedure instructions were given. A followup appointment was made.   Patient has been seeing orthopedics for his left knee.  He has tried oral medications, cortisone injections and gel injections which have not been helpful.  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  His orthopedic office wanted to have a genicular nerve block performed.  Will check updated x-rays and schedule pending insurance authorization. On exam today he does have medial joint line tenderness no evidence of effusion there is a mild valgus deformity at the left knee with antalgic gait using a cane.

## 2022-11-09 NOTE — Patient Instructions (Signed)

## 2022-11-22 DIAGNOSIS — M2142 Flat foot [pes planus] (acquired), left foot: Secondary | ICD-10-CM | POA: Diagnosis not present

## 2022-11-22 DIAGNOSIS — M19072 Primary osteoarthritis, left ankle and foot: Secondary | ICD-10-CM | POA: Diagnosis not present

## 2022-11-22 DIAGNOSIS — M25572 Pain in left ankle and joints of left foot: Secondary | ICD-10-CM | POA: Diagnosis not present

## 2022-11-24 DIAGNOSIS — Z96641 Presence of right artificial hip joint: Secondary | ICD-10-CM | POA: Diagnosis not present

## 2022-11-24 DIAGNOSIS — M1611 Unilateral primary osteoarthritis, right hip: Secondary | ICD-10-CM | POA: Diagnosis not present

## 2022-12-12 ENCOUNTER — Encounter: Payer: Medicare Other | Attending: Physical Medicine & Rehabilitation | Admitting: Physical Medicine & Rehabilitation

## 2022-12-12 ENCOUNTER — Encounter: Payer: Self-pay | Admitting: Physical Medicine & Rehabilitation

## 2022-12-12 VITALS — BP 165/92 | HR 77 | Temp 98.3°F | Ht 65.0 in

## 2022-12-12 DIAGNOSIS — G8929 Other chronic pain: Secondary | ICD-10-CM | POA: Diagnosis not present

## 2022-12-12 DIAGNOSIS — M25562 Pain in left knee: Secondary | ICD-10-CM | POA: Insufficient documentation

## 2022-12-12 MED ORDER — IOHEXOL 180 MG/ML  SOLN
3.0000 mL | Freq: Once | INTRAMUSCULAR | Status: AC
  Administered 2022-12-12: 3 mL via INTRAVENOUS

## 2022-12-12 MED ORDER — BUPIVACAINE HCL 0.5 % IJ SOLN
5.0000 mL | Freq: Once | INTRAMUSCULAR | Status: AC
  Administered 2022-12-12: 5 mL

## 2022-12-12 NOTE — Progress Notes (Signed)
  PROCEDURE RECORD [facility]   Name: William Wolf DOB:05-08-1951 MRN: [id number]  Date:12/12/2022  Physician: Claudette Laws, MD    Nurse/CMA: Dustin Bumbaugh RMA   Allergies:  Allergies  Allergen Reactions   Dexamethasone Anaphylaxis    Swell throat/any steroid injections    Consent Signed: Yes.    Is patient diabetic? No.  CBG today?   Pregnant: No. LMP: No LMP for male patient. (age 69-55)  Anticoagulants: no Anti-inflammatory: no Antibiotics: no  Procedure: Left Genicular Nerve Block- Knee  Position: Supine Start Time: 11:20  End Time: 11:35  Fluoro Time: 55  RN/CMA Dmarcus Decicco RMA  Ignace Mandigo RMA    Time 11:05AM 11:40    BP 165/92 16/78    Pulse 72 77    Respirations 16 16    O2 Sat 96 91    S/S 6 6    Pain Level 8/10 0/10     D/C home with Wife, patient A & O X 3, D/C instructions reviewed, and sits independently.

## 2022-12-12 NOTE — Progress Notes (Signed)
LEFT Genicular nerve block x 3, Upper medial, Upper lateral , and Lower Medial under fluoroscopic guidance  Indication Chronic Left Knee pain due to OA, which has not responded to conservative management such as physical therapy and medication management  Informed consent was obtained after describing risks and to the procedure to the patient these include bleeding bruising and infection, patient elects to proceed and has given written consent. Patient placed supine on the fluoroscopy table AP images of the knee joint were obtained. A 25-gauge 1.5 inch needle was used to anesthetize the skin and subcutaneous tissue with 1% lidocaine, 1.5 cc at each of 3 locations. Then a 22-gauge 3.5" spinal needle was inserted targeting the junction of the medial flare of the tibia with the shaft of the tibia, bone contact made and confirmed with lateral imaging. Then Omnipaque 180 x0.5 mL demonstrated no intravascular uptake followed by injection of 1.73ml .5% bupivacaine. Then the junction of the medial epicondyles of the femur with the femoral shaft was targeted needle was advanced under fluoroscopic guidance until bone contact. Appropriate depth was obtained and confirmed with lateral images. Then Omnipaque 180 x0.5 mL demonstrated no intravascular uptake followed by injection of 1.57ml of .5% bupivacaine. Then the junction of the lateral femoral condyle with the femoral shaft was targeted. 22-gauge 3.5 inch needle was advanced under fluoroscopic guidance until bone contact. Appropriate depth was confirmed with lateral imaging. 0.5 mL of Omnipaque 180 injected followed by injection of 1.5 cc of .5% bupivacaine solution. Patient tolerated procedure well. Post procedure instructions given  Lidocaine 1% with preservative 4.55ml Omnipaque 180 1.36ml Bupivacaine 0.5% 4.28ml   Pre injection pain 8/10 Post injection pain 0/10

## 2022-12-12 NOTE — Patient Instructions (Signed)
Left Knee genicular nerve  block with Marcaine (no cortisone)

## 2022-12-18 ENCOUNTER — Telehealth: Payer: Self-pay

## 2022-12-18 NOTE — Telephone Encounter (Signed)
Patient called in stating that he is not having any type of relief from the knee injection and he wants to know what the next steps are since he is not getting any relief

## 2022-12-19 NOTE — Telephone Encounter (Signed)
As we discussed with pt this was a test block and gave hime temporary 100% relief . Next step is RF which should be scheduled

## 2023-01-05 DIAGNOSIS — M1712 Unilateral primary osteoarthritis, left knee: Secondary | ICD-10-CM | POA: Diagnosis not present

## 2023-01-05 DIAGNOSIS — M25562 Pain in left knee: Secondary | ICD-10-CM | POA: Diagnosis not present

## 2023-01-11 ENCOUNTER — Encounter: Payer: Self-pay | Admitting: Physical Medicine & Rehabilitation

## 2023-01-11 ENCOUNTER — Encounter: Payer: Medicare Other | Attending: Physical Medicine & Rehabilitation | Admitting: Physical Medicine & Rehabilitation

## 2023-01-11 VITALS — BP 159/75 | HR 62 | Ht 65.0 in | Wt 235.0 lb

## 2023-01-11 DIAGNOSIS — M25562 Pain in left knee: Secondary | ICD-10-CM

## 2023-01-11 DIAGNOSIS — G8929 Other chronic pain: Secondary | ICD-10-CM

## 2023-01-11 MED ORDER — LIDOCAINE HCL (PF) 2 % IJ SOLN
5.0000 mL | Freq: Once | INTRAMUSCULAR | Status: AC
  Administered 2023-01-11: 5 mL

## 2023-01-11 MED ORDER — LIDOCAINE HCL 1 % IJ SOLN
10.0000 mL | Freq: Once | INTRAMUSCULAR | Status: AC
  Administered 2023-01-11: 10 mL

## 2023-01-11 NOTE — Patient Instructions (Signed)
You had a radio frequency procedure today ?This was done to alleviate joint pain in your left knee area ?We injected lidocaine which is a local anesthetic. ? ?You may experience soreness at the injection sites. ?You may also experienced some irritation of the nerves that were heated ?I'm recommending ice for 30 minutes every 2 hours as needed for the next 24-48 hours ? ? ?

## 2023-01-11 NOTE — Progress Notes (Signed)
  PROCEDURE RECORD [facility]   Name: William Wolf DOB:19-Dec-1950 MRN: [id number]  Date:01/11/2023  Physician: Claudette Laws, MD    Nurse/CMA: [name]  Allergies:  Allergies  Allergen Reactions   Dexamethasone Anaphylaxis    Swell throat/any steroid injections    Consent Signed: Yes.    Is patient diabetic? No.  CBG today?   Pregnant: No. LMP: No LMP for male patient. (age 70-55)  Anticoagulants: no Anti-inflammatory: no Antibiotics: no  Procedure: left genicular radiofrequency neurotomy  Position: Supine Start Time: 44010  End Time: 1207  Fluoro Time: 94  RN/CMA [name] RN [name] RN    Time 1129 1215    BP 159/75 166/79    Pulse 62 64    Respirations 14 14    O2 Sat 93 94    S/S 6 6    Pain Level 6 1     D/C home with wife, patient A & O X 3, D/C instructions reviewed, and sits independently.       Patient ID: William Wolf, male   DOB: 1951-08-28, 72 y.o.   MRN: [id number]

## 2023-01-11 NOTE — Progress Notes (Signed)
Thermal LEFT genicular nerve radiofrequency neurotomy under fluoroscopic guidance  Indication Chronic severe  knee pain that has not responded to PT, medication, and other conservative care.There has been 100% relief of usual knee pain with genicular nerve blocks under fluoroscopic guidance  Informed consent was obtained after discussing risks and benefits of procedure with the patient.  These include bleeding, bruising and infection as well as foot numbness. Pt place in supine position on the fluoro table .  Static images identified distal femur and proximal tibia.  Lateral and medial supracondylar , as well as medial tibial flare prepped with betadine.  Marked under fluoro and then a 25 g 1.5 inch needle was used to anesthetize skin and subcutaneous tissue with lidocaine 1%, 1.5 cc was infiltrate into each of 3 sites. Then a 18-gauge 10cm RF needle with a 10mm active  tip  was inserted under fluoroscopic guidance first targeting the medial tibial flare midpoint. After bone contact was made lateral images confirmed proper positioning at midpoint of shart ,then Motor stim at 2 Hz demonstrated no motor twitch followed by injection of 1.5 ml of 2% lidocaine at each of three sites and radiofrequency lesioning 80 deg for 80 sec. . This same procedure was treated for the lateral and medial supracondylar areas using same equipment and technique. Patient tolerated procedure well. Post procedure instructions given.

## 2023-02-02 DIAGNOSIS — H2513 Age-related nuclear cataract, bilateral: Secondary | ICD-10-CM | POA: Diagnosis not present

## 2023-02-02 DIAGNOSIS — H5203 Hypermetropia, bilateral: Secondary | ICD-10-CM | POA: Diagnosis not present

## 2023-02-06 DIAGNOSIS — M1712 Unilateral primary osteoarthritis, left knee: Secondary | ICD-10-CM | POA: Diagnosis not present

## 2023-02-08 ENCOUNTER — Encounter: Payer: Self-pay | Admitting: Physical Medicine & Rehabilitation

## 2023-02-08 ENCOUNTER — Encounter: Payer: Medicare Other | Attending: Physical Medicine & Rehabilitation | Admitting: Physical Medicine & Rehabilitation

## 2023-02-08 ENCOUNTER — Ambulatory Visit: Payer: Medicare Other | Admitting: Physical Medicine & Rehabilitation

## 2023-02-08 VITALS — BP 127/77 | HR 65 | Ht 65.0 in

## 2023-02-08 DIAGNOSIS — G8111 Spastic hemiplegia affecting right dominant side: Secondary | ICD-10-CM | POA: Diagnosis not present

## 2023-02-08 NOTE — Progress Notes (Signed)
Botox Injection for spasticity using needle EMG guidance ? ?Dilution: 50 Units/ml ?Indication: Severe spasticity which interferes with ADL,mobility and/or  hygiene and is unresponsive to medication management and other conservative care ?Informed consent was obtained after describing risks and benefits of the procedure with the patient. This includes bleeding, bruising, infection, excessive weakness, or medication side effects. A REMS form is on file and signed. ?Needle: 27g 1" needle electrode ?Number of units per muscle ?FCR 25 U ?FDP 25U ?FDS 25U targeted index finger with e stim ? ?FPL 25U  ?All injections were done after obtaining appropriate EMG activity and after negative drawback for blood. The patient tolerated the procedure well. Post procedure instructions were given. A followup appointment was made.  ?

## 2023-02-08 NOTE — Patient Instructions (Signed)

## 2023-02-12 DIAGNOSIS — Z79899 Other long term (current) drug therapy: Secondary | ICD-10-CM | POA: Diagnosis not present

## 2023-02-12 DIAGNOSIS — M79609 Pain in unspecified limb: Secondary | ICD-10-CM | POA: Diagnosis not present

## 2023-02-12 DIAGNOSIS — Z01818 Encounter for other preprocedural examination: Secondary | ICD-10-CM | POA: Diagnosis not present

## 2023-02-12 DIAGNOSIS — E559 Vitamin D deficiency, unspecified: Secondary | ICD-10-CM | POA: Diagnosis not present

## 2023-02-13 ENCOUNTER — Encounter: Payer: Medicare Other | Admitting: Physical Medicine & Rehabilitation

## 2023-02-19 DIAGNOSIS — Z471 Aftercare following joint replacement surgery: Secondary | ICD-10-CM | POA: Diagnosis not present

## 2023-02-19 DIAGNOSIS — M1712 Unilateral primary osteoarthritis, left knee: Secondary | ICD-10-CM | POA: Diagnosis not present

## 2023-02-19 DIAGNOSIS — G8918 Other acute postprocedural pain: Secondary | ICD-10-CM | POA: Diagnosis not present

## 2023-02-19 DIAGNOSIS — I10 Essential (primary) hypertension: Secondary | ICD-10-CM | POA: Diagnosis not present

## 2023-02-19 DIAGNOSIS — Z96652 Presence of left artificial knee joint: Secondary | ICD-10-CM | POA: Diagnosis not present

## 2023-02-23 DIAGNOSIS — Z7401 Bed confinement status: Secondary | ICD-10-CM | POA: Diagnosis not present

## 2023-02-23 DIAGNOSIS — I69954 Hemiplegia and hemiparesis following unspecified cerebrovascular disease affecting left non-dominant side: Secondary | ICD-10-CM | POA: Diagnosis not present

## 2023-02-23 DIAGNOSIS — Z7982 Long term (current) use of aspirin: Secondary | ICD-10-CM | POA: Diagnosis not present

## 2023-02-23 DIAGNOSIS — Z79899 Other long term (current) drug therapy: Secondary | ICD-10-CM | POA: Diagnosis not present

## 2023-02-23 DIAGNOSIS — M810 Age-related osteoporosis without current pathological fracture: Secondary | ICD-10-CM | POA: Diagnosis not present

## 2023-02-23 DIAGNOSIS — M6281 Muscle weakness (generalized): Secondary | ICD-10-CM | POA: Diagnosis not present

## 2023-02-23 DIAGNOSIS — E119 Type 2 diabetes mellitus without complications: Secondary | ICD-10-CM | POA: Diagnosis not present

## 2023-02-23 DIAGNOSIS — E7800 Pure hypercholesterolemia, unspecified: Secondary | ICD-10-CM | POA: Diagnosis not present

## 2023-02-23 DIAGNOSIS — Z6841 Body Mass Index (BMI) 40.0 and over, adult: Secondary | ICD-10-CM | POA: Diagnosis not present

## 2023-02-23 DIAGNOSIS — R2689 Other abnormalities of gait and mobility: Secondary | ICD-10-CM | POA: Diagnosis not present

## 2023-02-23 DIAGNOSIS — K5900 Constipation, unspecified: Secondary | ICD-10-CM | POA: Diagnosis not present

## 2023-02-23 DIAGNOSIS — R531 Weakness: Secondary | ICD-10-CM | POA: Diagnosis not present

## 2023-02-23 DIAGNOSIS — E785 Hyperlipidemia, unspecified: Secondary | ICD-10-CM | POA: Diagnosis not present

## 2023-02-23 DIAGNOSIS — Z471 Aftercare following joint replacement surgery: Secondary | ICD-10-CM | POA: Diagnosis not present

## 2023-02-23 DIAGNOSIS — R2681 Unsteadiness on feet: Secondary | ICD-10-CM | POA: Diagnosis not present

## 2023-02-23 DIAGNOSIS — I10 Essential (primary) hypertension: Secondary | ICD-10-CM | POA: Diagnosis not present

## 2023-02-23 DIAGNOSIS — M1712 Unilateral primary osteoarthritis, left knee: Secondary | ICD-10-CM | POA: Diagnosis not present

## 2023-02-23 DIAGNOSIS — K219 Gastro-esophageal reflux disease without esophagitis: Secondary | ICD-10-CM | POA: Diagnosis not present

## 2023-02-26 DIAGNOSIS — Z471 Aftercare following joint replacement surgery: Secondary | ICD-10-CM | POA: Diagnosis not present

## 2023-02-26 DIAGNOSIS — K219 Gastro-esophageal reflux disease without esophagitis: Secondary | ICD-10-CM | POA: Diagnosis not present

## 2023-02-26 DIAGNOSIS — G8918 Other acute postprocedural pain: Secondary | ICD-10-CM | POA: Diagnosis not present

## 2023-02-26 DIAGNOSIS — R2689 Other abnormalities of gait and mobility: Secondary | ICD-10-CM | POA: Diagnosis not present

## 2023-02-26 DIAGNOSIS — R0902 Hypoxemia: Secondary | ICD-10-CM | POA: Diagnosis not present

## 2023-02-26 DIAGNOSIS — M1712 Unilateral primary osteoarthritis, left knee: Secondary | ICD-10-CM | POA: Diagnosis not present

## 2023-02-26 DIAGNOSIS — R531 Weakness: Secondary | ICD-10-CM | POA: Diagnosis not present

## 2023-02-26 DIAGNOSIS — Z6841 Body Mass Index (BMI) 40.0 and over, adult: Secondary | ICD-10-CM | POA: Diagnosis not present

## 2023-02-26 DIAGNOSIS — M810 Age-related osteoporosis without current pathological fracture: Secondary | ICD-10-CM | POA: Diagnosis not present

## 2023-02-26 DIAGNOSIS — K5900 Constipation, unspecified: Secondary | ICD-10-CM | POA: Diagnosis not present

## 2023-02-26 DIAGNOSIS — Z96652 Presence of left artificial knee joint: Secondary | ICD-10-CM | POA: Diagnosis not present

## 2023-02-26 DIAGNOSIS — I119 Hypertensive heart disease without heart failure: Secondary | ICD-10-CM | POA: Diagnosis not present

## 2023-02-26 DIAGNOSIS — R2681 Unsteadiness on feet: Secondary | ICD-10-CM | POA: Diagnosis not present

## 2023-02-26 DIAGNOSIS — Z7401 Bed confinement status: Secondary | ICD-10-CM | POA: Diagnosis not present

## 2023-02-26 DIAGNOSIS — R262 Difficulty in walking, not elsewhere classified: Secondary | ICD-10-CM | POA: Diagnosis not present

## 2023-02-26 DIAGNOSIS — I10 Essential (primary) hypertension: Secondary | ICD-10-CM | POA: Diagnosis not present

## 2023-02-26 DIAGNOSIS — M6281 Muscle weakness (generalized): Secondary | ICD-10-CM | POA: Diagnosis not present

## 2023-02-26 DIAGNOSIS — E785 Hyperlipidemia, unspecified: Secondary | ICD-10-CM | POA: Diagnosis not present

## 2023-02-27 DIAGNOSIS — R262 Difficulty in walking, not elsewhere classified: Secondary | ICD-10-CM | POA: Diagnosis not present

## 2023-02-27 DIAGNOSIS — Z96652 Presence of left artificial knee joint: Secondary | ICD-10-CM | POA: Diagnosis not present

## 2023-02-27 DIAGNOSIS — I119 Hypertensive heart disease without heart failure: Secondary | ICD-10-CM | POA: Diagnosis not present

## 2023-02-27 DIAGNOSIS — G8918 Other acute postprocedural pain: Secondary | ICD-10-CM | POA: Diagnosis not present

## 2023-02-28 ENCOUNTER — Encounter: Payer: Self-pay | Admitting: Orthopedic Surgery

## 2023-04-03 DIAGNOSIS — M25561 Pain in right knee: Secondary | ICD-10-CM | POA: Diagnosis not present

## 2023-04-03 DIAGNOSIS — M6281 Muscle weakness (generalized): Secondary | ICD-10-CM | POA: Diagnosis not present

## 2023-04-03 DIAGNOSIS — R262 Difficulty in walking, not elsewhere classified: Secondary | ICD-10-CM | POA: Diagnosis not present

## 2023-04-09 DIAGNOSIS — R262 Difficulty in walking, not elsewhere classified: Secondary | ICD-10-CM | POA: Diagnosis not present

## 2023-04-09 DIAGNOSIS — M6281 Muscle weakness (generalized): Secondary | ICD-10-CM | POA: Diagnosis not present

## 2023-04-09 DIAGNOSIS — M25562 Pain in left knee: Secondary | ICD-10-CM | POA: Diagnosis not present

## 2023-04-11 MED FILL — Bupivacaine HCl Preservative Free (PF) Inj 0.5%: INTRAMUSCULAR | Qty: 9 | Status: AC

## 2023-04-16 DIAGNOSIS — M25562 Pain in left knee: Secondary | ICD-10-CM | POA: Diagnosis not present

## 2023-04-16 DIAGNOSIS — M6281 Muscle weakness (generalized): Secondary | ICD-10-CM | POA: Diagnosis not present

## 2023-04-16 DIAGNOSIS — R262 Difficulty in walking, not elsewhere classified: Secondary | ICD-10-CM | POA: Diagnosis not present

## 2023-04-18 DIAGNOSIS — M25562 Pain in left knee: Secondary | ICD-10-CM | POA: Diagnosis not present

## 2023-04-18 DIAGNOSIS — R262 Difficulty in walking, not elsewhere classified: Secondary | ICD-10-CM | POA: Diagnosis not present

## 2023-04-18 DIAGNOSIS — M6281 Muscle weakness (generalized): Secondary | ICD-10-CM | POA: Diagnosis not present

## 2023-04-23 DIAGNOSIS — M25562 Pain in left knee: Secondary | ICD-10-CM | POA: Diagnosis not present

## 2023-04-23 DIAGNOSIS — M6281 Muscle weakness (generalized): Secondary | ICD-10-CM | POA: Diagnosis not present

## 2023-04-23 DIAGNOSIS — R262 Difficulty in walking, not elsewhere classified: Secondary | ICD-10-CM | POA: Diagnosis not present

## 2023-04-25 DIAGNOSIS — M25562 Pain in left knee: Secondary | ICD-10-CM | POA: Diagnosis not present

## 2023-04-25 DIAGNOSIS — M6281 Muscle weakness (generalized): Secondary | ICD-10-CM | POA: Diagnosis not present

## 2023-04-25 DIAGNOSIS — R262 Difficulty in walking, not elsewhere classified: Secondary | ICD-10-CM | POA: Diagnosis not present

## 2023-04-30 DIAGNOSIS — R262 Difficulty in walking, not elsewhere classified: Secondary | ICD-10-CM | POA: Diagnosis not present

## 2023-04-30 DIAGNOSIS — M6281 Muscle weakness (generalized): Secondary | ICD-10-CM | POA: Diagnosis not present

## 2023-04-30 DIAGNOSIS — M25562 Pain in left knee: Secondary | ICD-10-CM | POA: Diagnosis not present

## 2023-05-02 DIAGNOSIS — M25562 Pain in left knee: Secondary | ICD-10-CM | POA: Diagnosis not present

## 2023-05-02 DIAGNOSIS — M6281 Muscle weakness (generalized): Secondary | ICD-10-CM | POA: Diagnosis not present

## 2023-05-02 DIAGNOSIS — R262 Difficulty in walking, not elsewhere classified: Secondary | ICD-10-CM | POA: Diagnosis not present

## 2023-05-08 DIAGNOSIS — M1712 Unilateral primary osteoarthritis, left knee: Secondary | ICD-10-CM | POA: Diagnosis not present

## 2023-05-08 DIAGNOSIS — Z96652 Presence of left artificial knee joint: Secondary | ICD-10-CM | POA: Diagnosis not present

## 2023-05-09 DIAGNOSIS — M6281 Muscle weakness (generalized): Secondary | ICD-10-CM | POA: Diagnosis not present

## 2023-05-09 DIAGNOSIS — M25562 Pain in left knee: Secondary | ICD-10-CM | POA: Diagnosis not present

## 2023-05-09 DIAGNOSIS — R262 Difficulty in walking, not elsewhere classified: Secondary | ICD-10-CM | POA: Diagnosis not present

## 2023-05-11 ENCOUNTER — Encounter: Payer: Medicare Other | Attending: Physical Medicine & Rehabilitation | Admitting: Physical Medicine & Rehabilitation

## 2023-05-11 ENCOUNTER — Encounter: Payer: Self-pay | Admitting: Physical Medicine & Rehabilitation

## 2023-05-11 VITALS — BP 123/79 | HR 80 | Ht 65.0 in

## 2023-05-11 DIAGNOSIS — G8111 Spastic hemiplegia affecting right dominant side: Secondary | ICD-10-CM

## 2023-05-11 MED ORDER — ONABOTULINUMTOXINA 100 UNITS IJ SOLR
100.0000 [IU] | Freq: Once | INTRAMUSCULAR | Status: AC
  Administered 2023-05-11: 100 [IU] via INTRAMUSCULAR

## 2023-05-11 NOTE — Patient Instructions (Signed)
You received a Botox injection today. You may experience soreness at the needle injection sites. Please call us if any of the injection sites turns red after a couple days or if there is any drainage. You may experience muscle weakness as a result of Botox. This would improve with time but can take several weeks to improve. The Botox should start working in about one week. The Botox usually last 3 months. The injection can be repeated every 3 months as needed.   Please call when you are done with Left Knee rehab so I can order PT for Right hip

## 2023-05-11 NOTE — Progress Notes (Signed)
Botox Injection for spasticity using needle EMG guidance ? ?Dilution: 50 Units/ml ?Indication: Severe spasticity which interferes with ADL,mobility and/or  hygiene and is unresponsive to medication management and other conservative care ?Informed consent was obtained after describing risks and benefits of the procedure with the patient. This includes bleeding, bruising, infection, excessive weakness, or medication side effects. A REMS form is on file and signed. ?Needle: 27g 1" needle electrode ?Number of units per muscle ?FCR 25 U ?FDP 25U ?FDS 25U targeted index finger with e stim ? ?FPL 25U  ?All injections were done after obtaining appropriate EMG activity and after negative drawback for blood. The patient tolerated the procedure well. Post procedure instructions were given. A followup appointment was made.  ?

## 2023-05-14 DIAGNOSIS — M25562 Pain in left knee: Secondary | ICD-10-CM | POA: Diagnosis not present

## 2023-05-14 DIAGNOSIS — R262 Difficulty in walking, not elsewhere classified: Secondary | ICD-10-CM | POA: Diagnosis not present

## 2023-05-14 DIAGNOSIS — M6281 Muscle weakness (generalized): Secondary | ICD-10-CM | POA: Diagnosis not present

## 2023-05-16 DIAGNOSIS — R262 Difficulty in walking, not elsewhere classified: Secondary | ICD-10-CM | POA: Diagnosis not present

## 2023-05-16 DIAGNOSIS — M25562 Pain in left knee: Secondary | ICD-10-CM | POA: Diagnosis not present

## 2023-05-16 DIAGNOSIS — M6281 Muscle weakness (generalized): Secondary | ICD-10-CM | POA: Diagnosis not present

## 2023-05-22 DIAGNOSIS — M25562 Pain in left knee: Secondary | ICD-10-CM | POA: Diagnosis not present

## 2023-05-22 DIAGNOSIS — M6281 Muscle weakness (generalized): Secondary | ICD-10-CM | POA: Diagnosis not present

## 2023-05-22 DIAGNOSIS — R262 Difficulty in walking, not elsewhere classified: Secondary | ICD-10-CM | POA: Diagnosis not present

## 2023-05-28 ENCOUNTER — Telehealth: Payer: Self-pay | Admitting: Physical Medicine & Rehabilitation

## 2023-05-28 DIAGNOSIS — M1611 Unilateral primary osteoarthritis, right hip: Secondary | ICD-10-CM

## 2023-05-28 NOTE — Telephone Encounter (Signed)
Pt is requesting referral for hip pain. He would like to go to [facility] in [place] phone [phone] fax [phone]

## 2023-05-29 DIAGNOSIS — R262 Difficulty in walking, not elsewhere classified: Secondary | ICD-10-CM | POA: Diagnosis not present

## 2023-05-29 DIAGNOSIS — M6281 Muscle weakness (generalized): Secondary | ICD-10-CM | POA: Diagnosis not present

## 2023-05-29 DIAGNOSIS — M25562 Pain in left knee: Secondary | ICD-10-CM | POA: Diagnosis not present

## 2023-05-30 NOTE — Telephone Encounter (Signed)
Referral placed.

## 2023-06-06 DIAGNOSIS — I10 Essential (primary) hypertension: Secondary | ICD-10-CM | POA: Diagnosis not present

## 2023-06-06 DIAGNOSIS — I69359 Hemiplegia and hemiparesis following cerebral infarction affecting unspecified side: Secondary | ICD-10-CM | POA: Diagnosis not present

## 2023-06-06 DIAGNOSIS — E1169 Type 2 diabetes mellitus with other specified complication: Secondary | ICD-10-CM | POA: Diagnosis not present

## 2023-06-06 DIAGNOSIS — E7800 Pure hypercholesterolemia, unspecified: Secondary | ICD-10-CM | POA: Diagnosis not present

## 2023-06-06 DIAGNOSIS — M150 Primary generalized (osteo)arthritis: Secondary | ICD-10-CM | POA: Diagnosis not present

## 2023-06-06 DIAGNOSIS — M6281 Muscle weakness (generalized): Secondary | ICD-10-CM | POA: Diagnosis not present

## 2023-06-06 DIAGNOSIS — M2560 Stiffness of unspecified joint, not elsewhere classified: Secondary | ICD-10-CM | POA: Diagnosis not present

## 2023-06-06 DIAGNOSIS — N3941 Urge incontinence: Secondary | ICD-10-CM | POA: Diagnosis not present

## 2023-06-06 DIAGNOSIS — M25551 Pain in right hip: Secondary | ICD-10-CM | POA: Diagnosis not present

## 2023-06-06 DIAGNOSIS — Z23 Encounter for immunization: Secondary | ICD-10-CM | POA: Diagnosis not present

## 2023-06-12 DIAGNOSIS — J209 Acute bronchitis, unspecified: Secondary | ICD-10-CM | POA: Diagnosis not present

## 2023-06-13 DIAGNOSIS — M2560 Stiffness of unspecified joint, not elsewhere classified: Secondary | ICD-10-CM | POA: Diagnosis not present

## 2023-06-13 DIAGNOSIS — M25551 Pain in right hip: Secondary | ICD-10-CM | POA: Diagnosis not present

## 2023-06-13 DIAGNOSIS — M6281 Muscle weakness (generalized): Secondary | ICD-10-CM | POA: Diagnosis not present

## 2023-06-18 DIAGNOSIS — M2560 Stiffness of unspecified joint, not elsewhere classified: Secondary | ICD-10-CM | POA: Diagnosis not present

## 2023-06-18 DIAGNOSIS — M6281 Muscle weakness (generalized): Secondary | ICD-10-CM | POA: Diagnosis not present

## 2023-06-18 DIAGNOSIS — M25551 Pain in right hip: Secondary | ICD-10-CM | POA: Diagnosis not present

## 2023-06-19 DIAGNOSIS — M1712 Unilateral primary osteoarthritis, left knee: Secondary | ICD-10-CM | POA: Diagnosis not present

## 2023-06-19 DIAGNOSIS — Z96652 Presence of left artificial knee joint: Secondary | ICD-10-CM | POA: Diagnosis not present

## 2023-06-25 DIAGNOSIS — M6281 Muscle weakness (generalized): Secondary | ICD-10-CM | POA: Diagnosis not present

## 2023-06-25 DIAGNOSIS — M25551 Pain in right hip: Secondary | ICD-10-CM | POA: Diagnosis not present

## 2023-06-25 DIAGNOSIS — M2560 Stiffness of unspecified joint, not elsewhere classified: Secondary | ICD-10-CM | POA: Diagnosis not present

## 2023-06-27 DIAGNOSIS — M25551 Pain in right hip: Secondary | ICD-10-CM | POA: Diagnosis not present

## 2023-06-27 DIAGNOSIS — M6281 Muscle weakness (generalized): Secondary | ICD-10-CM | POA: Diagnosis not present

## 2023-06-27 DIAGNOSIS — M2560 Stiffness of unspecified joint, not elsewhere classified: Secondary | ICD-10-CM | POA: Diagnosis not present

## 2023-07-02 DIAGNOSIS — M6281 Muscle weakness (generalized): Secondary | ICD-10-CM | POA: Diagnosis not present

## 2023-07-02 DIAGNOSIS — M25551 Pain in right hip: Secondary | ICD-10-CM | POA: Diagnosis not present

## 2023-07-02 DIAGNOSIS — M2560 Stiffness of unspecified joint, not elsewhere classified: Secondary | ICD-10-CM | POA: Diagnosis not present

## 2023-07-04 DIAGNOSIS — M6281 Muscle weakness (generalized): Secondary | ICD-10-CM | POA: Diagnosis not present

## 2023-07-04 DIAGNOSIS — M25551 Pain in right hip: Secondary | ICD-10-CM | POA: Diagnosis not present

## 2023-07-04 DIAGNOSIS — M2560 Stiffness of unspecified joint, not elsewhere classified: Secondary | ICD-10-CM | POA: Diagnosis not present

## 2023-07-09 DIAGNOSIS — M25551 Pain in right hip: Secondary | ICD-10-CM | POA: Diagnosis not present

## 2023-07-09 DIAGNOSIS — J0100 Acute maxillary sinusitis, unspecified: Secondary | ICD-10-CM | POA: Diagnosis not present

## 2023-07-09 DIAGNOSIS — M2560 Stiffness of unspecified joint, not elsewhere classified: Secondary | ICD-10-CM | POA: Diagnosis not present

## 2023-07-09 DIAGNOSIS — M6281 Muscle weakness (generalized): Secondary | ICD-10-CM | POA: Diagnosis not present

## 2023-07-09 DIAGNOSIS — J309 Allergic rhinitis, unspecified: Secondary | ICD-10-CM | POA: Diagnosis not present

## 2023-07-09 DIAGNOSIS — R0982 Postnasal drip: Secondary | ICD-10-CM | POA: Diagnosis not present

## 2023-07-11 DIAGNOSIS — M6281 Muscle weakness (generalized): Secondary | ICD-10-CM | POA: Diagnosis not present

## 2023-07-11 DIAGNOSIS — M2560 Stiffness of unspecified joint, not elsewhere classified: Secondary | ICD-10-CM | POA: Diagnosis not present

## 2023-07-11 DIAGNOSIS — M25551 Pain in right hip: Secondary | ICD-10-CM | POA: Diagnosis not present

## 2023-07-12 DIAGNOSIS — Z1331 Encounter for screening for depression: Secondary | ICD-10-CM | POA: Diagnosis not present

## 2023-07-12 DIAGNOSIS — Z0000 Encounter for general adult medical examination without abnormal findings: Secondary | ICD-10-CM | POA: Diagnosis not present

## 2023-07-12 DIAGNOSIS — Z9181 History of falling: Secondary | ICD-10-CM | POA: Diagnosis not present

## 2023-07-12 DIAGNOSIS — Z139 Encounter for screening, unspecified: Secondary | ICD-10-CM | POA: Diagnosis not present

## 2023-07-30 DIAGNOSIS — M6281 Muscle weakness (generalized): Secondary | ICD-10-CM | POA: Diagnosis not present

## 2023-07-30 DIAGNOSIS — M25551 Pain in right hip: Secondary | ICD-10-CM | POA: Diagnosis not present

## 2023-07-30 DIAGNOSIS — M2560 Stiffness of unspecified joint, not elsewhere classified: Secondary | ICD-10-CM | POA: Diagnosis not present

## 2023-08-01 DIAGNOSIS — M6281 Muscle weakness (generalized): Secondary | ICD-10-CM | POA: Diagnosis not present

## 2023-08-01 DIAGNOSIS — M25551 Pain in right hip: Secondary | ICD-10-CM | POA: Diagnosis not present

## 2023-08-01 DIAGNOSIS — M2560 Stiffness of unspecified joint, not elsewhere classified: Secondary | ICD-10-CM | POA: Diagnosis not present

## 2023-08-06 DIAGNOSIS — M6281 Muscle weakness (generalized): Secondary | ICD-10-CM | POA: Diagnosis not present

## 2023-08-06 DIAGNOSIS — M2560 Stiffness of unspecified joint, not elsewhere classified: Secondary | ICD-10-CM | POA: Diagnosis not present

## 2023-08-06 DIAGNOSIS — M25551 Pain in right hip: Secondary | ICD-10-CM | POA: Diagnosis not present

## 2023-08-10 DIAGNOSIS — M25551 Pain in right hip: Secondary | ICD-10-CM | POA: Diagnosis not present

## 2023-08-10 DIAGNOSIS — M2560 Stiffness of unspecified joint, not elsewhere classified: Secondary | ICD-10-CM | POA: Diagnosis not present

## 2023-08-10 DIAGNOSIS — M6281 Muscle weakness (generalized): Secondary | ICD-10-CM | POA: Diagnosis not present

## 2023-08-13 DIAGNOSIS — K219 Gastro-esophageal reflux disease without esophagitis: Secondary | ICD-10-CM | POA: Diagnosis not present

## 2023-08-13 DIAGNOSIS — M25551 Pain in right hip: Secondary | ICD-10-CM | POA: Diagnosis not present

## 2023-08-13 DIAGNOSIS — M2560 Stiffness of unspecified joint, not elsewhere classified: Secondary | ICD-10-CM | POA: Diagnosis not present

## 2023-08-13 DIAGNOSIS — M6281 Muscle weakness (generalized): Secondary | ICD-10-CM | POA: Diagnosis not present

## 2023-08-14 ENCOUNTER — Encounter: Payer: Medicare Other | Attending: Physical Medicine & Rehabilitation | Admitting: Physical Medicine & Rehabilitation

## 2023-08-14 ENCOUNTER — Encounter: Payer: Self-pay | Admitting: Physical Medicine & Rehabilitation

## 2023-08-14 VITALS — BP 134/80 | HR 76 | Ht 65.0 in | Wt 235.0 lb

## 2023-08-14 DIAGNOSIS — G8111 Spastic hemiplegia affecting right dominant side: Secondary | ICD-10-CM | POA: Insufficient documentation

## 2023-08-14 MED ORDER — SODIUM CHLORIDE (PF) 0.9 % IJ SOLN
2.0000 mL | Freq: Once | INTRAMUSCULAR | Status: AC
Start: 2023-08-14 — End: 2023-08-14
  Administered 2023-08-14: 2 mL

## 2023-08-14 MED ORDER — ONABOTULINUMTOXINA 100 UNITS IJ SOLR
100.0000 [IU] | Freq: Once | INTRAMUSCULAR | Status: AC
Start: 2023-08-14 — End: 2023-08-14
  Administered 2023-08-14: 100 [IU] via INTRAMUSCULAR

## 2023-08-14 NOTE — Progress Notes (Signed)
Botox Injection for spasticity using needle EMG guidance  Dilution: 50 Units/ml Indication: Severe spasticity which interferes with ADL,mobility and/or  hygiene and is unresponsive to medication management and other conservative care Informed consent was obtained after describing risks and benefits of the procedure with the patient. This includes bleeding, bruising, infection, excessive weakness, or medication side effects. A REMS form is on file and signed. Needle: 27g 1" needle electrode Number of units per muscle FCR 25 U FDP 25U FPL 25U  Waste 25U All injections were done after obtaining appropriate EMG activity and after negative drawback for blood. The patient tolerated the procedure well. Post procedure instructions were given. A followup appointment was made.

## 2023-09-25 DIAGNOSIS — R519 Headache, unspecified: Secondary | ICD-10-CM | POA: Diagnosis not present

## 2023-09-25 DIAGNOSIS — W19XXXA Unspecified fall, initial encounter: Secondary | ICD-10-CM | POA: Diagnosis not present

## 2023-09-25 DIAGNOSIS — I10 Essential (primary) hypertension: Secondary | ICD-10-CM | POA: Diagnosis not present

## 2023-09-25 DIAGNOSIS — Z043 Encounter for examination and observation following other accident: Secondary | ICD-10-CM | POA: Diagnosis not present

## 2023-09-25 DIAGNOSIS — R54 Age-related physical debility: Secondary | ICD-10-CM | POA: Diagnosis not present

## 2023-10-02 DIAGNOSIS — I10 Essential (primary) hypertension: Secondary | ICD-10-CM | POA: Diagnosis not present

## 2023-10-02 DIAGNOSIS — I69359 Hemiplegia and hemiparesis following cerebral infarction affecting unspecified side: Secondary | ICD-10-CM | POA: Diagnosis not present

## 2023-10-02 DIAGNOSIS — R053 Chronic cough: Secondary | ICD-10-CM | POA: Diagnosis not present

## 2023-10-23 DIAGNOSIS — I10 Essential (primary) hypertension: Secondary | ICD-10-CM | POA: Diagnosis not present

## 2023-10-23 DIAGNOSIS — R059 Cough, unspecified: Secondary | ICD-10-CM | POA: Diagnosis not present

## 2023-10-23 DIAGNOSIS — T464X5A Adverse effect of angiotensin-converting-enzyme inhibitors, initial encounter: Secondary | ICD-10-CM | POA: Diagnosis not present

## 2023-11-07 DIAGNOSIS — I10 Essential (primary) hypertension: Secondary | ICD-10-CM | POA: Diagnosis not present

## 2023-11-07 DIAGNOSIS — L309 Dermatitis, unspecified: Secondary | ICD-10-CM | POA: Diagnosis not present

## 2023-11-07 DIAGNOSIS — M150 Primary generalized (osteo)arthritis: Secondary | ICD-10-CM | POA: Diagnosis not present

## 2023-11-07 DIAGNOSIS — J309 Allergic rhinitis, unspecified: Secondary | ICD-10-CM | POA: Diagnosis not present

## 2023-11-12 ENCOUNTER — Other Ambulatory Visit: Payer: Self-pay | Admitting: *Deleted

## 2023-11-13 ENCOUNTER — Encounter: Payer: Medicare Other | Attending: Physical Medicine & Rehabilitation | Admitting: Physical Medicine & Rehabilitation

## 2023-11-13 ENCOUNTER — Encounter: Payer: Self-pay | Admitting: Physical Medicine & Rehabilitation

## 2023-11-13 VITALS — BP 129/79 | HR 73 | Ht 65.0 in | Wt 230.0 lb

## 2023-11-13 DIAGNOSIS — G8111 Spastic hemiplegia affecting right dominant side: Secondary | ICD-10-CM | POA: Diagnosis not present

## 2023-11-13 DIAGNOSIS — G8929 Other chronic pain: Secondary | ICD-10-CM | POA: Diagnosis not present

## 2023-11-13 DIAGNOSIS — M25572 Pain in left ankle and joints of left foot: Secondary | ICD-10-CM | POA: Insufficient documentation

## 2023-11-13 NOTE — Progress Notes (Signed)
 Botox Injection for spasticity using needle EMG guidance  Dilution: 50 Units/ml Indication: Severe spasticity which interferes with ADL,mobility and/or  hygiene and is unresponsive to medication management and other conservative care Informed consent was obtained after describing risks and benefits of the procedure with the patient. This includes bleeding, bruising, infection, excessive weakness, or medication side effects. A REMS form is on file and signed. Needle: 27g 1" needle electrode Number of units per muscle FCR 25 U FDP 25U FPL 25U  Waste 25U All injections were done after obtaining appropriate EMG activity and after negative drawback for blood. The patient tolerated the procedure well. Post procedure instructions were given. A followup appointment was made.

## 2023-11-13 NOTE — Patient Instructions (Signed)

## 2023-11-16 ENCOUNTER — Ambulatory Visit (INDEPENDENT_AMBULATORY_CARE_PROVIDER_SITE_OTHER): Admitting: Podiatry

## 2023-11-16 ENCOUNTER — Ambulatory Visit (INDEPENDENT_AMBULATORY_CARE_PROVIDER_SITE_OTHER)

## 2023-11-16 DIAGNOSIS — M76822 Posterior tibial tendinitis, left leg: Secondary | ICD-10-CM | POA: Diagnosis not present

## 2023-11-16 DIAGNOSIS — B351 Tinea unguium: Secondary | ICD-10-CM | POA: Diagnosis not present

## 2023-11-16 DIAGNOSIS — M21962 Unspecified acquired deformity of left lower leg: Secondary | ICD-10-CM

## 2023-11-16 DIAGNOSIS — M7752 Other enthesopathy of left foot: Secondary | ICD-10-CM | POA: Diagnosis not present

## 2023-11-16 DIAGNOSIS — M79674 Pain in right toe(s): Secondary | ICD-10-CM | POA: Diagnosis not present

## 2023-11-16 DIAGNOSIS — M79675 Pain in left toe(s): Secondary | ICD-10-CM | POA: Diagnosis not present

## 2023-11-16 DIAGNOSIS — R262 Difficulty in walking, not elsewhere classified: Secondary | ICD-10-CM | POA: Diagnosis not present

## 2023-11-16 MED ORDER — MELOXICAM 15 MG PO TABS: 15.0000 mg | ORAL_TABLET | Freq: Every day | ORAL | 0 refills | Status: DC

## 2023-11-16 NOTE — Progress Notes (Signed)
 Chief Complaint  Patient presents with   Ankle Pain    Left ankle pain. Been bothering him for a year. Went to [facility]. Has a brace but makes it hurt more. No steroids. He would like 2nd opinion. Flat footed is what ortho told him, he would also like to establish rfc going forward.  Not diabetic,    HPI: 73 y.o. male presents today with a family member for concern of pain near the left ankle.  He is here for a second opinion.  He notes that the pain has been bothering him for at least 1 year.  He went to [facility] orthoped[facility] and had an [place] brace dispensed.  He notes that it rubs the inner ankle area and causes more discomfort so he does not like to wear it.  He has not taken it back to be adjusted at this time.  Patient is requesting an injection to help with the pain today.  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    He is also requesting nail care today.  He has a history of TIA.    Past Medical History:  Diagnosis Date   Hypertension    Stroke [facility])     Past Surgical History:  Procedure Laterality Date   right knee sugery     TOTAL SHOULDER REPLACEMENT Right 07/24/2021    Allergies  Allergen Reactions   Dexamethasone Anaphylaxis    Swell throat/any steroid injections    Review of Systems  Musculoskeletal:  Positive for joint pain.     Physical Exam: Pedal pulses 2/4 both DP and PT arteries left foot.  Mild generalized edema to the lower leg and ankle area.  There is pain on palpation of the posterior tibial tendon from the inferior malleolus to the navicular area.  Positive windlass mechanism.  There is medial shifting of the medial malleolus in resting calcaneal stance position.  Pain with resisted supination of the foot along the posterior tibial tendon.  Has some pain with end range of motion of the left ankle joint without crepitus.  Decreased ankle dorsiflexion left.  Epicritic sensation is intact.  Toenails are 3 mm thick with yellow  discoloration, subungual debris, distal onycholysis, and pain with compression.  Radiographic Exam (left ankle, 3 weightbearing views, 11/16/2023):  Normal osseous mineralization.  The ankle mortise is intact with small anterior spur at the tibial plafond.  There is an inferior calcaneal spur noted.  There is decrease in calcaneal inclination angle, with the calcaneus almost parallel to the floor.  There is increased talar declination angle with complete collapse of the midfoot.  There appears to be a valgus position of the calcaneus but foot views were not obtained today to evaluate further.  Assessment/Plan of Care: 1. Posterior tibial tendon dysfunction, left   2. Capsulitis of left ankle   3. Deformity of left foot   4. Difficulty walking   5. Pain due to onychomycosis of toenails of both feet      Meds ordered this encounter  Medications   meloxicam (MOBIC) 15 MG tablet    Sig: Take 1 tablet (15 mg total) by mouth daily.    Dispense:  30 tablet    Refill:  0   Discussed clinical and radiographic findings with patient today.  Encourage patient to take the Richie brace back to [facility] to see if this can be adjusted.  He is requesting a different brace since this 1 is so uncomfortable.  He was fitted and dispensed a speed brace and did sign an Soil scientist noting that this may not be covered.  The patient has been unable to wear his Richie brace due to the poor fit and need of adjustment.  The toenails were debrided x 10 with sterile nail nippers and a power debriding bur.  Prescription for meloxicam 15 mg 1 tab p.o. daily was sent to his pharmacy.  Informed the patient that we could not perform a cortisone injection today due to his history of anaphylaxis.  Will refer the patient to one of our ankle and reconstructive surgeons, Dr. Lilian Kapur, for surgical evaluation to see if he is a candidate for any type of procedure that could possibly place his foot in better  alignment with the leg.  Clerance Lav, DPM, FACFAS [facility]     [address] [place], [place] [address]                Office [phone]  Fax [phone]

## 2023-11-28 ENCOUNTER — Other Ambulatory Visit: Payer: Self-pay | Admitting: Podiatry

## 2023-12-06 ENCOUNTER — Ambulatory Visit (INDEPENDENT_AMBULATORY_CARE_PROVIDER_SITE_OTHER): Admitting: Podiatry

## 2023-12-06 DIAGNOSIS — R262 Difficulty in walking, not elsewhere classified: Secondary | ICD-10-CM | POA: Diagnosis not present

## 2023-12-06 DIAGNOSIS — M76822 Posterior tibial tendinitis, left leg: Secondary | ICD-10-CM

## 2023-12-06 MED ORDER — DIFLUNISAL 500 MG PO TABS: 500.0000 mg | ORAL_TABLET | Freq: Two times a day (BID) | ORAL | 0 refills | Status: AC

## 2023-12-06 NOTE — Progress Notes (Signed)
 Chief Complaint  Patient presents with   Foot Pain    He is here in follow up for left tibial tendon dysfunction, he states he feels like it is getting worse. He is wearing the speed brace and stated that it might be time for the boot. Not diabetic and takes ASA 81   HPI: 73 y.o. male presents today for follow-up of the left posterior tibial tendon dysfunction and collapse of rear foot.  He has been wearing the sleeve brace and feels that it is not providing enough support.  The patient was asked if he went back to [facility] to have his Richie brace adjusted, and he stated they were of no help.  He is requesting the pneumatic cam walker that we had discussed on his previous visit until he has his appointment with Dr. Lilian Kapur for a surgical consultation.  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  He notes he would not be able to have any surgery until the fall.  Noted that the anti-inflammatories were ineffective.  Past Medical History:  Diagnosis Date   Hypertension    Stroke [facility])     Past Surgical History:  Procedure Laterality Date   right knee sugery     TOTAL SHOULDER REPLACEMENT Right 07/24/2021    Allergies  Allergen Reactions   Dexamethasone Anaphylaxis    Swell throat/any steroid injections    Physical Exam: Palpable pedal pulses noted.  There is moderate pain on palpation along the posterior tibial tendon on the left foot.  There is medial collapse of the rear foot noted.  This is manually reducible to an improved position to the leg.  Pain with resisted inversion of the foot along the posterior tibial tendon.  No gaps or nodules noted within the posterior tibial tendon.  Assessment/Plan of Care: 1. Posterior tibial tendon dysfunction, left   2. Difficulty walking      Meds ordered this encounter  Medications   diflunisal (DOLOBID) 500 MG TABS tablet    Sig: Take 1 tablet (500 mg total) by mouth 2 (two)  times daily.    Dispense:  60 tablet    Refill:  0   Discussed clinical findings with patient today.  He was fitted for a pneumatic cam walker today.  He was instructed on how to utilize this.  He does not need to sleep with that and can remove for bathing.  He should have it on all times weightbearing since he is not getting any improvement from the speed brace or the previous Richie brace.  These have been ineffective and helping his ability to walk and improving pain.  They have not supported his deformity enough to keep him comfortable.  A Medicare ABN was obtained today.  Will switch to prescription diflunisal 500 mg 1 tab p.o. twice daily for 30 days to see if this provides any improvement of pain and inflammation.  Informed the patient that we cannot modify his Richie brace as this will avoid the warranty.  [facility], in which ever vendor they utilized for these braces, need to adjust/modify this.   Follow-up as scheduled with Dr. Lilian Kapur for surgical consultation.   Clerance Lav, DPM, FACFAS [facility]     [address]  [place], [place] [address]                Office [phone]  Fax [phone]

## 2023-12-20 ENCOUNTER — Ambulatory Visit (INDEPENDENT_AMBULATORY_CARE_PROVIDER_SITE_OTHER): Admitting: Podiatry

## 2023-12-20 ENCOUNTER — Encounter: Payer: Self-pay | Admitting: Podiatry

## 2023-12-20 ENCOUNTER — Ambulatory Visit (INDEPENDENT_AMBULATORY_CARE_PROVIDER_SITE_OTHER)

## 2023-12-20 VITALS — Ht 65.0 in | Wt 230.0 lb

## 2023-12-20 DIAGNOSIS — M76822 Posterior tibial tendinitis, left leg: Secondary | ICD-10-CM

## 2023-12-20 DIAGNOSIS — M2142 Flat foot [pes planus] (acquired), left foot: Secondary | ICD-10-CM

## 2023-12-20 DIAGNOSIS — M66872 Spontaneous rupture of other tendons, left ankle and foot: Secondary | ICD-10-CM | POA: Diagnosis not present

## 2023-12-20 DIAGNOSIS — S93692A Other sprain of left foot, initial encounter: Secondary | ICD-10-CM | POA: Diagnosis not present

## 2023-12-20 NOTE — Patient Instructions (Addendum)
 Call [facility] to schedule your MRI at the below locations.  Please allow at least 1 business day after your visit to process the referral.  It may take longer depending on approval from insurance.  Please let me know if you have issues or problems scheduling the MRI   [facility] [phone] [address] [place], [place] [address]   Look for Voltaren gel at the pharmacy over the counter or online (also known as diclofenac 1% gel). Apply to the painful areas 3-4x daily with the supplied dosing card. Allow to dry for 10 minutes before going into socks/shoes   VISIT SUMMARY:  Today, you were seen for severe flat foot deformity and associated pain. We discussed your condition, its progression, and potential treatment options. We also reviewed your history of stroke and current management.  YOUR PLAN:  -END-STAGE FLATFOOT DEFORMITY: This condition means that the arch of your foot has collapsed, causing pain and difficulty with walking. We recommend getting an MRI of your ankle to evaluate the stability of your tendons, ligaments, and joints. Based on the MRI results, we will discuss surgical options, which may include joint fusion and arch reconstruction. After surgery, you will need to avoid putting weight on your foot for about two months, followed by five to six weeks in a walking boot. Physical therapy will be important to help you regain strength and mobility. In the meantime, you should use topical Voltaren gel for pain management, applying it four times a day.  -STROKE: You had a stroke in 2019 but have not experienced significant problems since then. You should continue taking aspirin as part of your ongoing management.  INSTRUCTIONS:  Please schedule an MRI of your ankle as soon as possible. We will discuss the results and potential surgical options at your next visit. Continue using aspirin and start using the topical Voltaren gel for pain  management. If you have any questions or concerns, please contact our office.

## 2023-12-20 NOTE — Progress Notes (Signed)
 Subjective:  Patient ID: William Wolf, male    DOB: 03-21-1951,  MRN: [id number]  Chief Complaint  Patient presents with   Foot Pain    Pt is here to discuss surgery options.    Discussed the use of AI scribe software for clinical note transcription with the patient, who gave verbal consent to proceed.  History of Present Illness William Wolf is a 73 year old male who presents with severe flat foot deformity and associated pain.  He has a very flat foot with pain throughout the foot, particularly on the inside of the ankle and in the sinus tarsi area. The pain varies in intensity, with some days being better than others. There is no history of specific injury to the foot, and the condition has developed gradually over time. He has not undergone an MRI of the ankle previously.  He experienced a stroke in 2019 but reports no significant problems since then. There is no weakness in the leg, although he mentions occasional issues on the same side as the stroke. He is not on blood thinners but takes aspirin.  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 He lives in a single-level home with a ramp and has previously used a rehab facility for knee issues, which he found beneficial.  He is allergic to steroids and cannot take cortisone shots. He takes aspirin and has been advised to use topical Voltaren gel for pain management, which he has not tried yet.      Objective:    Physical Exam VASCULAR: DP and PT pulse palpable. Foot is warm and well-perfused. Capillary fill time is brisk. DERMATOLOGIC: Normal skin turgor, texture, and temperature. No open lesions, rashes, or ulcerations. NEUROLOGIC: Normal sensation to light touch and pressure. No paresthesias. ORTHOPEDIC: Severe pes plano valgus deformity with collapse of the medial weight bearing arch. Pain on palpation of posterior tibial tendon. Weakness  with resisted inversion. Pain in sinus tarsi. No ecchymosis or bruising.     No images are attached to the encounter.    Results RADIOLOGY Foot and Calcaneus X-ray: Significant pes planus valgus deformity with collapse at the [place] and subtalar joint. Ankle mortis is still relatively well aligned. (12/20/2023) Ankle X-ray: Significant pes planus valgus deformity with collapse at the [place] and subtalar joint. Ankle mortis is still relatively well aligned. (12/06/2023)   Assessment:   1. Nontraumatic rupture of left posterior tibial tendon   2. Spring ligament tear, left, initial encounter   3. Acquired pes planovalgus, left      Plan:  Patient was evaluated and treated and all questions answered.  Assessment and Plan Assessment & Plan End-stage flatfoot deformity Severe pes plano valgus deformity with collapse of the medial weight-bearing arch, pain on palpation of the posterior tibial tendon, and weakness with resisted inversion. Radiographs show significant pes planus valgus deformity with collapse at the naviculocuneiform and subtalar joints, while the ankle mortise remains relatively well-aligned. The condition is at the end stage, with the tendon and ligament that support the arch having given out, leading to the collapse. Surgery is recommended to correct the deformity before it worsens, which could lead to ulceration or severe arthritis. Surgical options include joint fusion and arch reconstruction, with the potential need for ankle joint fusion if instability is detected. Post-surgery, he will be non-weight bearing for approximately two months, followed by five to six weeks in a walking boot. Physical therapy will be necessary  to regain strength and mobility. - Order MRI of the ankle to evaluate the tendon, ligament, and ankle joint stability. - Discuss surgical options based on MRI results, including potential joint fusion and arch reconstruction. - Recommend pre- and post-operative  physical therapy to ensure safe mobility and strength recovery. - Advise use of a rolling knee scooter, wheelchair, or rolling walker post-surgery. - Prescribe topical Voltaren gel for pain management, to be applied four times a day.  Stroke He had a stroke in 2019 with no subsequent problems or significant weakness reported. He is currently on aspirin therapy. - Continue aspirin therapy.      Return in about 2 months (around 02/19/2024) for MRI review and surgical planning visit for left foot.

## 2023-12-23 ENCOUNTER — Encounter: Payer: Self-pay | Admitting: Podiatry

## 2023-12-25 DIAGNOSIS — I10 Essential (primary) hypertension: Secondary | ICD-10-CM | POA: Diagnosis not present

## 2023-12-25 DIAGNOSIS — E1169 Type 2 diabetes mellitus with other specified complication: Secondary | ICD-10-CM | POA: Diagnosis not present

## 2023-12-25 DIAGNOSIS — E7800 Pure hypercholesterolemia, unspecified: Secondary | ICD-10-CM | POA: Diagnosis not present

## 2023-12-26 DIAGNOSIS — Z23 Encounter for immunization: Secondary | ICD-10-CM | POA: Diagnosis not present

## 2023-12-26 DIAGNOSIS — I10 Essential (primary) hypertension: Secondary | ICD-10-CM | POA: Diagnosis not present

## 2023-12-26 DIAGNOSIS — I69359 Hemiplegia and hemiparesis following cerebral infarction affecting unspecified side: Secondary | ICD-10-CM | POA: Diagnosis not present

## 2023-12-26 DIAGNOSIS — E7800 Pure hypercholesterolemia, unspecified: Secondary | ICD-10-CM | POA: Diagnosis not present

## 2023-12-26 DIAGNOSIS — M150 Primary generalized (osteo)arthritis: Secondary | ICD-10-CM | POA: Diagnosis not present

## 2023-12-26 DIAGNOSIS — E1169 Type 2 diabetes mellitus with other specified complication: Secondary | ICD-10-CM | POA: Diagnosis not present

## 2023-12-26 DIAGNOSIS — N3941 Urge incontinence: Secondary | ICD-10-CM | POA: Diagnosis not present

## 2024-02-05 ENCOUNTER — Ambulatory Visit
Admission: RE | Admit: 2024-02-05 | Discharge: 2024-02-05 | Disposition: A | Discharge status: Home / Self Care | Source: Ambulatory Visit | Attending: Podiatry

## 2024-02-05 DIAGNOSIS — M19072 Primary osteoarthritis, left ankle and foot: Secondary | ICD-10-CM | POA: Diagnosis not present

## 2024-02-05 DIAGNOSIS — R600 Localized edema: Secondary | ICD-10-CM | POA: Diagnosis not present

## 2024-02-05 DIAGNOSIS — S93692A Other sprain of left foot, initial encounter: Secondary | ICD-10-CM

## 2024-02-05 DIAGNOSIS — M66872 Spontaneous rupture of other tendons, left ankle and foot: Secondary | ICD-10-CM

## 2024-02-05 DIAGNOSIS — M25572 Pain in left ankle and joints of left foot: Secondary | ICD-10-CM | POA: Diagnosis not present

## 2024-02-14 ENCOUNTER — Ambulatory Visit: Admitting: Physical Medicine & Rehabilitation

## 2024-02-19 ENCOUNTER — Encounter: Payer: Self-pay | Admitting: Physical Medicine & Rehabilitation

## 2024-02-19 ENCOUNTER — Encounter: Attending: Physical Medicine & Rehabilitation | Admitting: Physical Medicine & Rehabilitation

## 2024-02-19 VITALS — BP 124/75 | HR 75 | Ht 65.0 in | Wt 230.0 lb

## 2024-02-19 DIAGNOSIS — G8929 Other chronic pain: Secondary | ICD-10-CM | POA: Diagnosis not present

## 2024-02-19 DIAGNOSIS — Z96641 Presence of right artificial hip joint: Secondary | ICD-10-CM | POA: Insufficient documentation

## 2024-02-19 DIAGNOSIS — M25551 Pain in right hip: Secondary | ICD-10-CM | POA: Insufficient documentation

## 2024-02-19 DIAGNOSIS — G8111 Spastic hemiplegia affecting right dominant side: Secondary | ICD-10-CM | POA: Diagnosis not present

## 2024-02-19 MED ORDER — ONABOTULINUMTOXINA 100 UNITS IJ SOLR
100.0000 [IU] | Freq: Once | INTRAMUSCULAR | Status: AC
  Administered 2024-02-19: 100 [IU] via INTRAMUSCULAR

## 2024-02-19 MED ORDER — SODIUM CHLORIDE (PF) 0.9 % IJ SOLN
2.0000 mL | Freq: Once | INTRAMUSCULAR | Status: AC
  Administered 2024-02-19: 2 mL via INTRAVENOUS

## 2024-02-19 NOTE — Progress Notes (Signed)
 Botox  Injection for spasticity using needle EMG guidance  Dilution: 50 Units/ml Indication: Severe spasticity which interferes with ADL,mobility and/or  hygiene and is unresponsive to medication management and other conservative care Informed consent was obtained after describing risks and benefits of the procedure with the patient. This includes bleeding, bruising, infection, excessive weakness, or medication side effects. A REMS form is on file and signed. Needle: 27g 1 needle electrode Number of units per muscle FCR 25 U FDP 25U FPL 25U  Waste 25U All injections were done after obtaining appropriate EMG activity and after negative drawback for blood. The patient tolerated the procedure well. Post procedure instructions were given. A followup appointment was made.   We discussed chronic hip pain and increasing debility related to right hemiparesis from stroke.  He is also gained weight.  I have ordered some physical therapy to see if we can improve his mobility status.  We discussed recommendations for weight loss.

## 2024-02-19 NOTE — Patient Instructions (Signed)

## 2024-02-21 ENCOUNTER — Ambulatory Visit: Admitting: Podiatry

## 2024-02-21 ENCOUNTER — Encounter: Payer: Self-pay | Admitting: Podiatry

## 2024-02-21 VITALS — Ht 65.0 in | Wt 230.0 lb

## 2024-02-21 DIAGNOSIS — M25372 Other instability, left ankle: Secondary | ICD-10-CM | POA: Diagnosis not present

## 2024-02-21 DIAGNOSIS — M79675 Pain in left toe(s): Secondary | ICD-10-CM | POA: Diagnosis not present

## 2024-02-21 DIAGNOSIS — M2142 Flat foot [pes planus] (acquired), left foot: Secondary | ICD-10-CM

## 2024-02-21 DIAGNOSIS — M19272 Secondary osteoarthritis, left ankle and foot: Secondary | ICD-10-CM

## 2024-02-21 DIAGNOSIS — B351 Tinea unguium: Secondary | ICD-10-CM | POA: Diagnosis not present

## 2024-02-21 DIAGNOSIS — M79674 Pain in right toe(s): Secondary | ICD-10-CM

## 2024-02-22 NOTE — Progress Notes (Signed)
 Subjective:  Patient ID: William Wolf, male    DOB: [birth year],  MRN: [id number]  Chief Complaint  Patient presents with   Nail Problem    Pt is here to discuss MRI and surgery options also would like to have his toenais cut.    Discussed the use of AI scribe software for clinical note transcription with the patient, who gave verbal consent to proceed.  History of Present Illness William Wolf is a 73 year old male who presents with severe flat foot deformity and associated pain.  He has a very flat foot with pain throughout the foot, particularly on the inside of the ankle and in the sinus tarsi area. The pain varies in intensity, with some days being better than others. There is no history of specific injury to the foot, and the condition has developed gradually over time. He has not undergone an MRI of the ankle previously.  He experienced a stroke in 2019 but reports no significant problems since then. There is no weakness in the leg, although he mentions occasional issues on the same side as the stroke. He is not on blood thinners but takes aspirin.  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 He lives in a single-level home with a ramp and has previously used a rehab facility for knee issues, which he found beneficial.  He is allergic to steroids and cannot take cortisone shots. He takes aspirin and has been advised to use topical Voltaren gel for pain management, which he has not tried yet.  Interval history: He returns for follow-up he completed the MRI, symptoms overall unchanged. His nails are thick and cause pain in shoes as well   Objective:    Physical Exam VASCULAR: DP and PT pulse palpable. Foot is warm and well-perfused. Capillary fill time is brisk. DERMATOLOGIC: Normal skin turgor, texture, and temperature. No open lesions, rashes, or ulcerations. Mycotic thick toenails x10 with  subungual debris NEUROLOGIC: Normal sensation to light touch and pressure. No paresthesias. ORTHOPEDIC: Severe pes plano valgus deformity with collapse of the medial weight bearing arch. Pain on palpation of posterior tibial tendon. Weakness with resisted inversion. Pain in sinus tarsi. No ecchymosis or bruising.     No images are attached to the encounter.    Results RADIOLOGY Foot and Calcaneus X-ray: Significant pes planus valgus deformity with collapse at the [place] and subtalar joint. Ankle mortis is still relatively well aligned. (12/20/2023) Ankle X-ray: Significant pes planus valgus deformity with collapse at the [place] and subtalar joint. Ankle mortis is still relatively well aligned. (12/06/2023)    Study Result  Narrative & Impression  EXAM DESCRIPTION: MR ANKLE LEFT WO CONTRAST   CLINICAL HISTORY: Ankle pain, tendon abnormality suspected, neg xray   COMPARISON: None Available.   TECHNIQUE: MRI of the ankle is performed according to our usual protocol with multiplanar multi sequence imaging.   FINDINGS: No fracture. No erosions. Moderate degenerative change to the subtalar joint with joint space narrowing and mild to moderate degenerative edema greater to the talus. Also small subchondral cysts to the inferior talus. Mild degenerative change to the calcaneocuboid joint. The marrow signal is otherwise unremarkable.   Mild to moderate subcutaneous edema. The musculature is unremarkable. The tendons are unremarkable. The ligaments are intact.   IMPRESSION: Moderate subtalar osteoarthritis and mild calcaneocuboid osteoarthritis.   Mild to moderate subcutaneous edema   Assessment:   1. Acquired pes planovalgus, left  2. Other secondary osteoarthritis of left foot   3. Ankle instability, left       Plan:  Patient was evaluated and treated and all questions answered.  Assessment and Plan Assessment & Plan End-stage flatfoot deformity We reviewed his MRI results,  discussed the severity of the arthritis in the subtalar joint as well as early arthritic changes in the calcaneocuboid joint and talonavicular joint.  Discussed with him that at his age osteotomies were not likely to be a successful treatment option for him and I would recommend triple arthrodesis.  We discussed the risk benefits and recovery process of this and this would require a period of nonweightbearing for approximately 8 weeks followed by partial weightbearing in a boot for 4 weeks and will be a 3 to 32-month recovery.  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  We discussed the period of nonweightbearing as well and he would like to plan for being able to go to a short-term rehab following his surgery which I think is reasonable considering his age and size.  Outpatient to inpatient hospital surgery will be scheduled and plan for rehab admission following surgery.  Discussed all risk benefits and potential complications of the procedure including not limited to pain, swelling, infection, scar, numbness which may be temporary or permanent, chronic pain, stiffness, nerve pain or damage, wound healing problems, bone healing problems including delayed or non-union.  We also discussed that the deltoid and medial ankle appears to be attenuated on the MRI and his plain film radiographs shows slight valgus positioning of the ankle and I would recommend medial ankle stabilization with deltoid imbrication and repair in addition to the triple arthrodesis and bone grafting.  All questions addressed.  Form sent signed and reviewed.  Surgical be scheduled this fall.   Surgical plan:  Procedure: - Triple arthrodesis with deltoid repair, BMA and bone grafting  Location: - [facility]  Anesthesia plan: - General With regional block  Postoperative pain plan: - Tylenol  1000 mg every 6 hours,gabapentin 300 mg every 8 hours x5 days, oxycodone 5  mg 1-2 tabs every 6 hours only as needed  DVT prophylaxis: - Xarelto 10 mg nightly postop  WB Restrictions / DME needs: - Nonweightbearing in splint postop     Discussed the etiology and treatment options for the condition in detail with the patient. Recommended debridement of the nails today. Sharp and mechanical debridement performed of all painful and mycotic nails today. Nails debrided in length and thickness using a nail nipper to level of comfort. Follow up as needed for painful nails.        No follow-ups on file.

## 2024-03-04 DIAGNOSIS — M25551 Pain in right hip: Secondary | ICD-10-CM | POA: Diagnosis not present

## 2024-03-04 DIAGNOSIS — R2689 Other abnormalities of gait and mobility: Secondary | ICD-10-CM | POA: Diagnosis not present

## 2024-03-04 DIAGNOSIS — M25651 Stiffness of right hip, not elsewhere classified: Secondary | ICD-10-CM | POA: Diagnosis not present

## 2024-03-04 DIAGNOSIS — M6281 Muscle weakness (generalized): Secondary | ICD-10-CM | POA: Diagnosis not present

## 2024-03-10 DIAGNOSIS — M25551 Pain in right hip: Secondary | ICD-10-CM | POA: Diagnosis not present

## 2024-03-10 DIAGNOSIS — M25651 Stiffness of right hip, not elsewhere classified: Secondary | ICD-10-CM | POA: Diagnosis not present

## 2024-03-10 DIAGNOSIS — R2689 Other abnormalities of gait and mobility: Secondary | ICD-10-CM | POA: Diagnosis not present

## 2024-03-10 DIAGNOSIS — M6281 Muscle weakness (generalized): Secondary | ICD-10-CM | POA: Diagnosis not present

## 2024-03-12 DIAGNOSIS — M25651 Stiffness of right hip, not elsewhere classified: Secondary | ICD-10-CM | POA: Diagnosis not present

## 2024-03-12 DIAGNOSIS — M25551 Pain in right hip: Secondary | ICD-10-CM | POA: Diagnosis not present

## 2024-03-12 DIAGNOSIS — R2689 Other abnormalities of gait and mobility: Secondary | ICD-10-CM | POA: Diagnosis not present

## 2024-03-12 DIAGNOSIS — M6281 Muscle weakness (generalized): Secondary | ICD-10-CM | POA: Diagnosis not present

## 2024-03-13 DIAGNOSIS — R220 Localized swelling, mass and lump, head: Secondary | ICD-10-CM | POA: Diagnosis not present

## 2024-03-13 DIAGNOSIS — S0993XA Unspecified injury of face, initial encounter: Secondary | ICD-10-CM | POA: Diagnosis not present

## 2024-03-13 DIAGNOSIS — S0181XA Laceration without foreign body of other part of head, initial encounter: Secondary | ICD-10-CM | POA: Diagnosis not present

## 2024-03-13 DIAGNOSIS — W19XXXA Unspecified fall, initial encounter: Secondary | ICD-10-CM | POA: Diagnosis not present

## 2024-03-13 DIAGNOSIS — S2232XA Fracture of one rib, left side, initial encounter for closed fracture: Secondary | ICD-10-CM | POA: Diagnosis not present

## 2024-03-19 ENCOUNTER — Telehealth: Payer: Self-pay | Admitting: Podiatry

## 2024-03-19 NOTE — Telephone Encounter (Signed)
 Received consent forms for surgery.  Called pt to get scheduled and he is wanting to call back to schedule as he has fallen and broken ribs and would like that to heal before having the foot surgery. He is to call back to schedule.

## 2024-03-21 DIAGNOSIS — H6121 Impacted cerumen, right ear: Secondary | ICD-10-CM | POA: Diagnosis not present

## 2024-03-21 DIAGNOSIS — I69359 Hemiplegia and hemiparesis following cerebral infarction affecting unspecified side: Secondary | ICD-10-CM | POA: Diagnosis not present

## 2024-03-21 DIAGNOSIS — S0181XA Laceration without foreign body of other part of head, initial encounter: Secondary | ICD-10-CM | POA: Diagnosis not present

## 2024-03-21 DIAGNOSIS — Z4802 Encounter for removal of sutures: Secondary | ICD-10-CM | POA: Diagnosis not present

## 2024-03-21 DIAGNOSIS — S2232XA Fracture of one rib, left side, initial encounter for closed fracture: Secondary | ICD-10-CM | POA: Diagnosis not present

## 2024-03-31 DIAGNOSIS — R2689 Other abnormalities of gait and mobility: Secondary | ICD-10-CM | POA: Diagnosis not present

## 2024-03-31 DIAGNOSIS — M6281 Muscle weakness (generalized): Secondary | ICD-10-CM | POA: Diagnosis not present

## 2024-03-31 DIAGNOSIS — M25551 Pain in right hip: Secondary | ICD-10-CM | POA: Diagnosis not present

## 2024-03-31 DIAGNOSIS — M25651 Stiffness of right hip, not elsewhere classified: Secondary | ICD-10-CM | POA: Diagnosis not present

## 2024-04-01 ENCOUNTER — Encounter: Admitting: Physical Medicine & Rehabilitation

## 2024-04-04 DIAGNOSIS — H5203 Hypermetropia, bilateral: Secondary | ICD-10-CM | POA: Diagnosis not present

## 2024-04-04 DIAGNOSIS — H2513 Age-related nuclear cataract, bilateral: Secondary | ICD-10-CM | POA: Diagnosis not present

## 2024-04-04 DIAGNOSIS — H52223 Regular astigmatism, bilateral: Secondary | ICD-10-CM | POA: Diagnosis not present

## 2024-04-10 ENCOUNTER — Encounter: Payer: Self-pay | Admitting: Physical Medicine & Rehabilitation

## 2024-04-10 ENCOUNTER — Encounter: Attending: Physical Medicine & Rehabilitation | Admitting: Physical Medicine & Rehabilitation

## 2024-04-10 VITALS — BP 138/80 | HR 73 | Ht 65.0 in | Wt 230.0 lb

## 2024-04-10 DIAGNOSIS — G8320 Monoplegia of upper limb affecting unspecified side: Secondary | ICD-10-CM | POA: Diagnosis not present
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
## 2024-04-23 DIAGNOSIS — M25551 Pain in right hip: Secondary | ICD-10-CM | POA: Diagnosis not present

## 2024-04-23 DIAGNOSIS — M6281 Muscle weakness (generalized): Secondary | ICD-10-CM | POA: Diagnosis not present

## 2024-04-23 DIAGNOSIS — R2689 Other abnormalities of gait and mobility: Secondary | ICD-10-CM | POA: Diagnosis not present

## 2024-04-23 DIAGNOSIS — M25651 Stiffness of right hip, not elsewhere classified: Secondary | ICD-10-CM | POA: Diagnosis not present

## 2024-04-30 DIAGNOSIS — R2689 Other abnormalities of gait and mobility: Secondary | ICD-10-CM | POA: Diagnosis not present

## 2024-04-30 DIAGNOSIS — M25651 Stiffness of right hip, not elsewhere classified: Secondary | ICD-10-CM | POA: Diagnosis not present

## 2024-04-30 DIAGNOSIS — M25551 Pain in right hip: Secondary | ICD-10-CM | POA: Diagnosis not present

## 2024-04-30 DIAGNOSIS — M6281 Muscle weakness (generalized): Secondary | ICD-10-CM | POA: Diagnosis not present

## 2024-05-07 DIAGNOSIS — M6281 Muscle weakness (generalized): Secondary | ICD-10-CM | POA: Diagnosis not present

## 2024-05-07 DIAGNOSIS — M25551 Pain in right hip: Secondary | ICD-10-CM | POA: Diagnosis not present

## 2024-05-07 DIAGNOSIS — R2689 Other abnormalities of gait and mobility: Secondary | ICD-10-CM | POA: Diagnosis not present

## 2024-05-07 DIAGNOSIS — M25651 Stiffness of right hip, not elsewhere classified: Secondary | ICD-10-CM | POA: Diagnosis not present

## 2024-05-12 DIAGNOSIS — M6281 Muscle weakness (generalized): Secondary | ICD-10-CM | POA: Diagnosis not present

## 2024-05-12 DIAGNOSIS — M25651 Stiffness of right hip, not elsewhere classified: Secondary | ICD-10-CM | POA: Diagnosis not present

## 2024-05-12 DIAGNOSIS — R2689 Other abnormalities of gait and mobility: Secondary | ICD-10-CM | POA: Diagnosis not present

## 2024-05-12 DIAGNOSIS — M25551 Pain in right hip: Secondary | ICD-10-CM | POA: Diagnosis not present

## 2024-05-14 DIAGNOSIS — M6281 Muscle weakness (generalized): Secondary | ICD-10-CM | POA: Diagnosis not present

## 2024-05-14 DIAGNOSIS — M25551 Pain in right hip: Secondary | ICD-10-CM | POA: Diagnosis not present

## 2024-05-14 DIAGNOSIS — M25651 Stiffness of right hip, not elsewhere classified: Secondary | ICD-10-CM | POA: Diagnosis not present

## 2024-05-14 DIAGNOSIS — R2689 Other abnormalities of gait and mobility: Secondary | ICD-10-CM | POA: Diagnosis not present

## 2024-06-03 ENCOUNTER — Encounter: Attending: Physical Medicine & Rehabilitation | Admitting: Physical Medicine & Rehabilitation

## 2024-06-03 ENCOUNTER — Encounter: Payer: Self-pay | Admitting: Physical Medicine & Rehabilitation

## 2024-06-03 VITALS — BP 146/71 | HR 74 | Ht 65.0 in | Wt 230.0 lb

## 2024-06-03 DIAGNOSIS — I69351 Hemiplegia and hemiparesis following cerebral infarction affecting right dominant side: Secondary | ICD-10-CM | POA: Diagnosis not present

## 2024-06-03 DIAGNOSIS — G8320 Monoplegia of upper limb affecting unspecified side: Secondary | ICD-10-CM

## 2024-06-03 MED ORDER — SODIUM CHLORIDE (PF) 0.9 % IJ SOLN
2.0000 mL | Freq: Once | INTRAMUSCULAR | Status: AC
  Administered 2024-06-03: 2 mL

## 2024-06-03 MED ORDER — ONABOTULINUMTOXINA 100 UNITS IJ SOLR
100.0000 [IU] | Freq: Once | INTRAMUSCULAR | Status: AC
  Administered 2024-06-03: 100 [IU] via INTRAMUSCULAR

## 2024-06-03 NOTE — Progress Notes (Signed)
 Botox  Injection for spasticity using needle EMG guidance  Dilution: 50 Units/ml Indication: Severe spasticity which interferes with ADL,mobility and/or  hygiene and is unresponsive to medication management and other conservative care Informed consent was obtained after describing risks and benefits of the procedure with the patient. This includes bleeding, bruising, infection, excessive weakness, or medication side effects. A REMS form is on file and signed. Needle: 27g 1 needle electrode Number of units per muscle FCR 25 U FDP 50U to dig 2 and 3 estim used FPL 25U   All injections were done after obtaining appropriate EMG activity and after negative drawback for blood. The patient tolerated the procedure well. Post procedure instructions were given. A followup appointment was made.

## 2024-06-03 NOTE — Patient Instructions (Signed)

## 2024-06-23 DIAGNOSIS — E1169 Type 2 diabetes mellitus with other specified complication: Secondary | ICD-10-CM | POA: Diagnosis not present

## 2024-06-23 DIAGNOSIS — E7800 Pure hypercholesterolemia, unspecified: Secondary | ICD-10-CM | POA: Diagnosis not present

## 2024-06-23 DIAGNOSIS — Z125 Encounter for screening for malignant neoplasm of prostate: Secondary | ICD-10-CM | POA: Diagnosis not present

## 2024-06-23 DIAGNOSIS — R5383 Other fatigue: Secondary | ICD-10-CM | POA: Diagnosis not present

## 2024-06-26 DIAGNOSIS — L600 Ingrowing nail: Secondary | ICD-10-CM | POA: Diagnosis not present

## 2024-06-26 DIAGNOSIS — Z23 Encounter for immunization: Secondary | ICD-10-CM | POA: Diagnosis not present

## 2024-06-26 DIAGNOSIS — I10 Essential (primary) hypertension: Secondary | ICD-10-CM | POA: Diagnosis not present

## 2024-06-26 DIAGNOSIS — E7800 Pure hypercholesterolemia, unspecified: Secondary | ICD-10-CM | POA: Diagnosis not present

## 2024-06-26 DIAGNOSIS — I69359 Hemiplegia and hemiparesis following cerebral infarction affecting unspecified side: Secondary | ICD-10-CM | POA: Diagnosis not present

## 2024-06-26 DIAGNOSIS — M150 Primary generalized (osteo)arthritis: Secondary | ICD-10-CM | POA: Diagnosis not present

## 2024-06-26 DIAGNOSIS — N3941 Urge incontinence: Secondary | ICD-10-CM | POA: Diagnosis not present

## 2024-06-26 DIAGNOSIS — R5383 Other fatigue: Secondary | ICD-10-CM | POA: Diagnosis not present

## 2024-06-26 DIAGNOSIS — E1169 Type 2 diabetes mellitus with other specified complication: Secondary | ICD-10-CM | POA: Diagnosis not present

## 2024-07-07 DIAGNOSIS — Z23 Encounter for immunization: Secondary | ICD-10-CM | POA: Diagnosis not present

## 2024-07-17 ENCOUNTER — Encounter: Attending: Physical Medicine & Rehabilitation | Admitting: Physical Medicine & Rehabilitation

## 2024-07-17 ENCOUNTER — Encounter: Payer: Self-pay | Admitting: Physical Medicine & Rehabilitation

## 2024-07-17 VITALS — BP 124/66 | HR 80 | Ht 65.0 in | Wt 230.0 lb

## 2024-07-17 DIAGNOSIS — G8320 Monoplegia of upper limb affecting unspecified side: Secondary | ICD-10-CM | POA: Insufficient documentation

## 2024-07-17 NOTE — Progress Notes (Signed)
 Subjective:    Patient ID: William Wolf, male    DOB: [birth year], 73 y.o.   MRN: [id number]  HPI Discussed the use of AI scribe software for clinical note transcription with the patient, who gave verbal consent to proceed.  History of Present Illness [name] is a 73 year old male with a history of stroke who presents for follow-up regarding Botox  treatment for right hand spasticity.  He received Botox  treatment approximately six weeks ago, which has provided some improvement in his right hand function, but not to the extent he desires. He has difficulty using his thumb and index finger for tasks such as picking up objects, and the middle finger is not functioning as well as he would like.  His hips are still causing some trouble, but he manages the discomfort. No new issues with ribs, and they are reported to be stable.  He is currently not working as much and is preparing for the next year. He has one full-time helper and occasionally receives assistance from a neighbor. 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    FCR 25 U FDP 50U to dig 2 and 3 estim used FPL 25U    Pain Inventory Average Pain 3 Pain Right Now 2 My pain is intermittent and aching  In the last 24 hours, has pain interfered with the following? General activity 0 Relation with others 0 Enjoyment of life 0 What TIME of day is your pain at its worst? night Sleep (in general) Good  Pain is worse with: bending and some activites Pain improves with: injections Relief from Meds: 5  No family history on file. Social History   Socioeconomic History   Marital status: Married    Spouse name: Not on file   Number of children: Not on file   Years of education: Not on file   Highest education level: Not on file  Occupational History   Not on file  Tobacco Use   Smoking status: Never   Smokeless tobacco: Never  Vaping Use    Vaping status: Never Used  Substance and Sexual Activity   Alcohol use: Not Currently   Drug use: Not Currently   Sexual activity: Not on file  Other Topics Concern   Not on file  Social History Narrative   Not on file   Social Drivers of Health   Financial Resource Strain: Not on file  Food Insecurity: Not on file  Transportation Needs: Not on file  Physical Activity: Not on file  Stress: Not on file  Social Connections: Not on file   Past Surgical History:  Procedure Laterality Date   right knee sugery     TOTAL SHOULDER REPLACEMENT Right 07/24/2021   Past Surgical History:  Procedure Laterality Date   right knee sugery     TOTAL SHOULDER REPLACEMENT Right 07/24/2021   Past Medical History:  Diagnosis Date   Hypertension    Stroke (HCC)    Ht 5' 5 (1.651 m)   Wt 230 lb (104.3 kg)   BMI 38.27 kg/m   Opioid Risk Score:   Fall Risk Score:  `1  Depression screen PHQ 2/9     07/17/2024    2:21 PM 04/10/2024    2:19 PM 02/19/2024    2:41 PM 11/13/2023    1:04 PM 08/14/2023   12:13 PM 05/11/2023   12:07 PM 01/11/2023   11:30 AM  Depression screen PHQ 2/9  Decreased Interest 0 0 0 0 0 0 0  Down, Depressed, Hopeless  0 0 0 0 0 0  PHQ - 2 Score 0 0 0 0 0 0 0    Review of Systems  Musculoskeletal:        Right hip pain, right hand pain  All other systems reviewed and are negative.      Objective:   Physical Exam MAS 2 spasticity in the index finger at the PIP and DIP on the right side. MAS 0 at the wrist flexor MAS 0 at the thumb flexor MSK noted to have swan-neck deformities in 3rd and 4th digits of the right hand Ambulates short step length antalgic gait favoring left lower extremity Speech without dysarthria or aphasia Mood and affect are appropriate      Assessment & Plan:  Assessment and Plan Assessment & Plan Spasticity and monoplegia of right upper limb Spasticity persists in the right upper limb, affecting grip function. Previous Botox  provided  partial improvement. Balancing spasticity reduction with hand function is crucial.  Plans for next Botox  injection FCR 25 U FDP 25U to dig 2 and 3 estim used FDS 25U target dig 2 with estim FPL 25U

## 2024-07-17 NOTE — Patient Instructions (Signed)
  VISIT SUMMARY: Today, you had a follow-up visit to discuss the Botox  treatment for your right hand spasticity. You reported some improvement but still have difficulty with certain tasks. We administered another round of Botox  to help improve your hand function.  YOUR PLAN: SPASTICITY AND MONOPLEGIA OF RIGHT UPPER LIMB: You continue to experience spasticity in your right hand, which affects your ability to grip and perform tasks. -We plan to administer 100 units of Botox  to your right arm, targeting four muscles to improve function and minimize weakness.                      Contains text generated by [employer]bridge.                                 Contains text generated by [employer]bridge.

## 2024-07-18 DIAGNOSIS — J22 Unspecified acute lower respiratory infection: Secondary | ICD-10-CM | POA: Diagnosis not present

## 2024-08-27 ENCOUNTER — Ambulatory Visit: Admitting: Podiatry

## 2024-08-27 DIAGNOSIS — M79675 Pain in left toe(s): Secondary | ICD-10-CM | POA: Diagnosis not present

## 2024-08-27 DIAGNOSIS — L600 Ingrowing nail: Secondary | ICD-10-CM

## 2024-08-27 DIAGNOSIS — B351 Tinea unguium: Secondary | ICD-10-CM | POA: Diagnosis not present

## 2024-08-27 DIAGNOSIS — M79674 Pain in right toe(s): Secondary | ICD-10-CM | POA: Diagnosis not present

## 2024-08-27 NOTE — Progress Notes (Signed)
" °   °  °  Subjective:  Patient ID: William Wolf, male    DOB: Oct 28, 1950,  MRN: [id number]  William Wolf presents to clinic today for:  Chief Complaint  Patient presents with   Ingrown Toenail    Left hallux nail, lateral border. Went to a salon and now has pain in this toe. Not currently infected. PCP wrote him ABX, but does not remember which one. He did complete them.  A1c is unknow, but he was told it was good.  ASA   Patient notes nails are thick, discolored, elongated and painful in shoegear when trying to ambulate.  Patient notes he had a recent ingrown toenail to the left great toe along the lateral border 2 to 3 months ago after getting a pedicure.  He did take antibiotics orally at that time and it helped resolve the issue, but he still has had some pain.  PCP is Wolf, William CROME, MD.  Past Medical History:  Diagnosis Date   Hypertension    Stroke [facility])    Past Surgical History:  Procedure Laterality Date   right knee sugery     TOTAL SHOULDER REPLACEMENT Right 07/24/2021   Allergies[1]  Review of Systems: Negative except as noted in the HPI.  Objective:  General Wearing is a pleasant 73 y.o. male in NAD. AAO x 3.  Vascular Examination: Capillary refill time is 3-5 seconds to toes bilateral. Palpable pedal pulses b/l LE. Digital hair present b/l.  Skin temperature gradient WNL b/l. No varicosities b/l. No cyanosis noted b/l.   Dermatological Examination: Pedal skin with normal turgor, texture and tone b/l. No open wounds. No interdigital macerations b/l. Toenails x10 are 3mm thick, discolored, dystrophic with subungual debris. There is pain with compression of the nail plates.  They are elongated x10.  The left hallux lateral nail border is slightly incurvated with pain on palpation of the area.  No active drainage or erythema is present.   Assessment/Plan: 1. Pain due to onychomycosis of toenails of both feet   2. Ingrown toenail    The mycotic toenails were sharply  debrided x10 with sterile nail nippers and a power debriding burr to decrease bulk/thickness and length.  The left hallux lateral border was cut back to alleviate discomfort.  Time was taken to discuss the option of a PNA procedure to the left hallux lateral nail border at a separate visit from his scheduled routine footcare.  Discussed the procedure in detail and postoperative expectations regarding daily care to the toe nail border post procedure.  He will call the office and request this appointment if he would like to proceed if the nail pain continues.  Return in about 10 weeks (around 11/05/2024) for [facility].   William Wolf, DPM, FACFAS [facility]     [address] [place], [place] [address]                Office [phone]  Fax [phone]    [1]  Allergies Allergen Reactions   Dexamethasone Anaphylaxis    Swell throat/any steroid injections   Corticosteroids    Hydrochlorothiazide    "

## 2024-09-02 ENCOUNTER — Encounter: Attending: Physical Medicine & Rehabilitation | Admitting: Physical Medicine & Rehabilitation

## 2024-09-02 ENCOUNTER — Encounter: Payer: Self-pay | Admitting: Physical Medicine & Rehabilitation

## 2024-09-02 VITALS — BP 136/76 | HR 81 | Ht 65.0 in

## 2024-09-02 DIAGNOSIS — G8320 Monoplegia of upper limb affecting unspecified side: Secondary | ICD-10-CM | POA: Diagnosis present

## 2024-09-02 DIAGNOSIS — I69331 Monoplegia of upper limb following cerebral infarction affecting right dominant side: Secondary | ICD-10-CM | POA: Insufficient documentation

## 2024-09-02 MED ORDER — SODIUM CHLORIDE (PF) 0.9 % IJ SOLN
2.0000 mL | Freq: Once | INTRAMUSCULAR | Status: AC
  Administered 2024-09-02: 2 mL

## 2024-09-02 MED ORDER — ONABOTULINUMTOXINA 100 UNITS IJ SOLR
100.0000 [IU] | Freq: Once | INTRAMUSCULAR | Status: AC
  Administered 2024-09-02: 100 [IU] via INTRAMUSCULAR

## 2024-09-02 NOTE — Patient Instructions (Signed)

## 2024-09-02 NOTE — Progress Notes (Signed)
 Botox  Injection for spasticity using needle EMG guidance  Dilution: 50 Units/ml Indication: Severe spasticity which interferes with ADL,mobility and/or  hygiene and is unresponsive to medication management and other conservative care Informed consent was obtained after describing risks and benefits of the procedure with the patient. This includes bleeding, bruising, infection, excessive weakness, or medication side effects. A REMS form is on file and signed. Needle: 27g 1 needle electrode Number of units per muscle FCR 25 U FDP 25U to dig 2 estim  FDS 25U target dig 2 with estim FPL 25U   All injections were done after obtaining appropriate EMG activity and after negative drawback for blood. The patient tolerated the procedure well. Post procedure instructions were given. A followup appointment was made.

## 2024-10-09 ENCOUNTER — Encounter: Attending: Physical Medicine & Rehabilitation | Admitting: Physical Medicine & Rehabilitation

## 2024-10-09 ENCOUNTER — Encounter: Admitting: Physical Medicine & Rehabilitation

## 2024-10-09 ENCOUNTER — Encounter: Payer: Self-pay | Admitting: Physical Medicine & Rehabilitation

## 2024-10-09 VITALS — BP 134/79 | HR 78 | Ht 65.0 in | Wt 230.0 lb

## 2024-10-09 DIAGNOSIS — G8929 Other chronic pain: Secondary | ICD-10-CM

## 2024-10-09 DIAGNOSIS — M25572 Pain in left ankle and joints of left foot: Secondary | ICD-10-CM

## 2024-10-09 MED ORDER — LIDOCAINE HCL 1 % IJ SOLN
5.0000 mL | Freq: Once | INTRAMUSCULAR | Status: AC
  Administered 2024-10-09: 5 mL

## 2024-10-09 MED ORDER — KETOROLAC TROMETHAMINE 30 MG/ML IJ SOLN
30.0000 mg | Freq: Once | INTRAMUSCULAR | Status: AC
  Administered 2024-10-09: 30 mg via INTRA_ARTICULAR

## 2024-10-09 NOTE — Patient Instructions (Signed)
 [address]

## 2024-10-09 NOTE — Progress Notes (Signed)
 Left ankle intra articular injection with Toradol  and lidocaine  For severe osteoarthritis Ultrasound guidance using 13hz  Hockey stick transducer  74 year old male with history of severe left ankle pain and osteoarthritis.  He has failed conservative care.  Pain limits ambulation. Steroid allergy required ketorolac   Informed consent was obtained after describing the risks and benefits of the procedure including bleeding bruising and infection.  Patient elects to proceed and has given written consent.  Patient placed in a semirecumbent position with the left knee bent at 90 degrees and left ankle plantarflexed 20 degrees.  The tibiotalar joint on the left side was prescan just medial to the anterior tibialis tendon.  Vascular structures were identified.  Then the area was marked and prepped with Betadine after timeout.  Then a 25-gauge 1-1/2 inch needle was inserted under long-axis ultrasound views just medial to the anterior tibialis tenderness, 3 cc of 1% lidocaine  were infiltrated.  Then in echo block needle 22-gauge 50 mm was inserted under direct ultrasound guidance and guided into the tibiotalar joint just superior to the talar dome images taken and saved.  Then a solution containing 1 mL of 30 mg/mL ketorolac  and 1 mL of 1% lidocaine  were injected.  Patient tolerated procedure well

## 2024-11-05 ENCOUNTER — Ambulatory Visit: Admitting: Podiatry

## 2024-11-06 ENCOUNTER — Encounter: Admitting: Physical Medicine & Rehabilitation

## 2024-12-02 ENCOUNTER — Encounter: Admitting: Physical Medicine & Rehabilitation
